# Patient Record
Sex: Male | Born: 1940 | Race: White | Hispanic: No | Marital: Married | Smoking: Current every day smoker
Health system: Southern US, Community
[De-identification: ages and names within clinical notes are randomized; demographics above are authoritative.]

## PROBLEM LIST (undated history)

## (undated) DIAGNOSIS — R238 Other skin changes: Secondary | ICD-10-CM

## (undated) DIAGNOSIS — I10 Essential (primary) hypertension: Secondary | ICD-10-CM

## (undated) DIAGNOSIS — R233 Spontaneous ecchymoses: Secondary | ICD-10-CM

## (undated) DIAGNOSIS — J189 Pneumonia, unspecified organism: Secondary | ICD-10-CM

## (undated) DIAGNOSIS — M1990 Unspecified osteoarthritis, unspecified site: Secondary | ICD-10-CM

## (undated) HISTORY — PX: TONSILLECTOMY: SUR1361

## (undated) HISTORY — PX: KNEE ARTHROSCOPY: SUR90

## (undated) HISTORY — PX: COLONOSCOPY: SHX174

---

## 2001-10-16 ENCOUNTER — Emergency Department (HOSPITAL_COMMUNITY): Admission: EM | Admit: 2001-10-16 | Discharge: 2001-10-16 | Payer: Self-pay | Admitting: *Deleted

## 2003-11-17 ENCOUNTER — Emergency Department (HOSPITAL_COMMUNITY): Admission: EM | Admit: 2003-11-17 | Discharge: 2003-11-18 | Payer: Self-pay | Admitting: Emergency Medicine

## 2012-12-30 ENCOUNTER — Other Ambulatory Visit: Payer: Self-pay | Admitting: Family Medicine

## 2012-12-30 DIAGNOSIS — Z87891 Personal history of nicotine dependence: Secondary | ICD-10-CM

## 2013-01-05 ENCOUNTER — Ambulatory Visit
Admission: RE | Admit: 2013-01-05 | Discharge: 2013-01-05 | Disposition: A | Discharge status: Home / Self Care | Payer: Medicare Other | Source: Ambulatory Visit | Attending: Family Medicine | Admitting: Family Medicine

## 2013-01-05 DIAGNOSIS — Z87891 Personal history of nicotine dependence: Secondary | ICD-10-CM

## 2014-08-07 IMAGING — US US AORTA SCREENING (MEDICARE)
1 series · 14 of 19 positions shown · non-contrast
Comparison: None.

CLINICAL DATA: Screening for abdominal aortic aneurysm.  Smoker.

EXAM:
ABDOMINAL AORTA SCREENING ULTRASOUND
TECHNIQUE: Ultrasound examination of the abdominal aorta was performed as a
screening evaluation for abdominal aortic aneurysm.

[Series 1: us aorta screening (medicare) · 0.21mm/px · 14 of 19 slices shown]
[im 1/19]
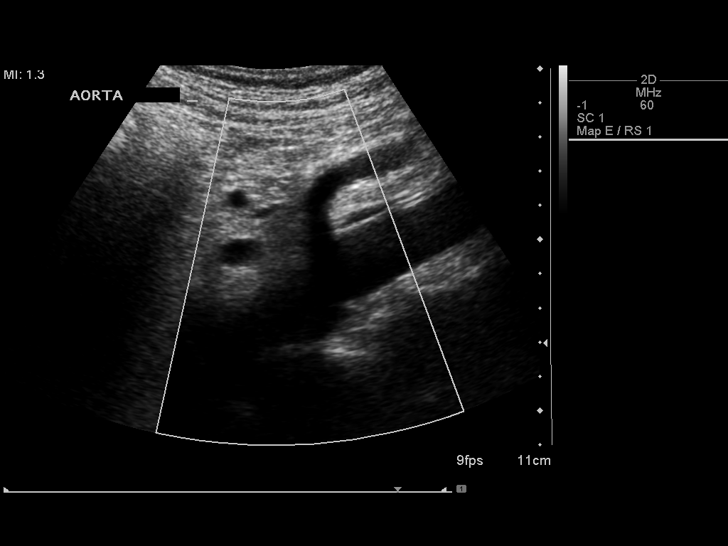
[im 3/19]
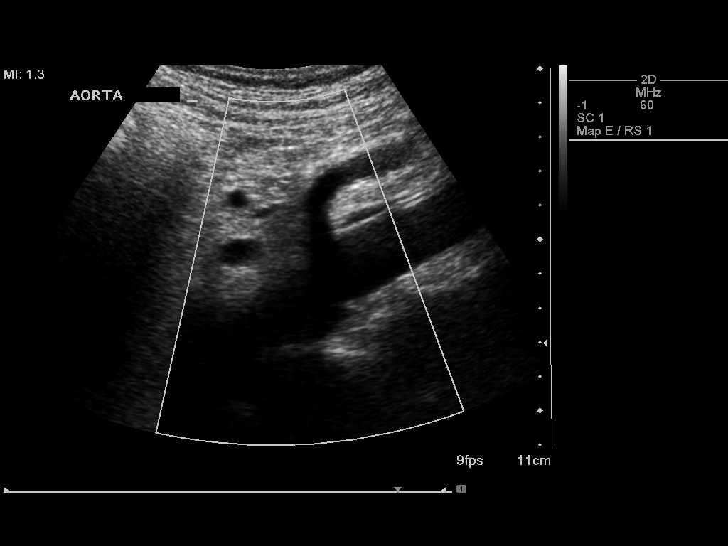
[im 4/19]
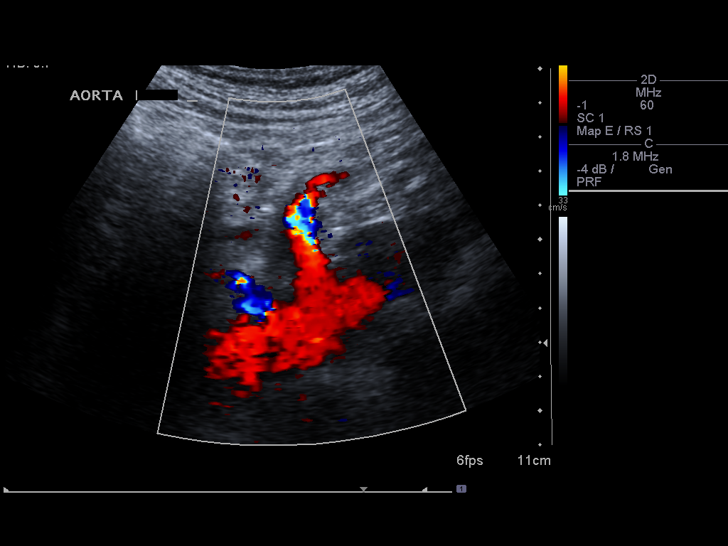
[im 5/19]
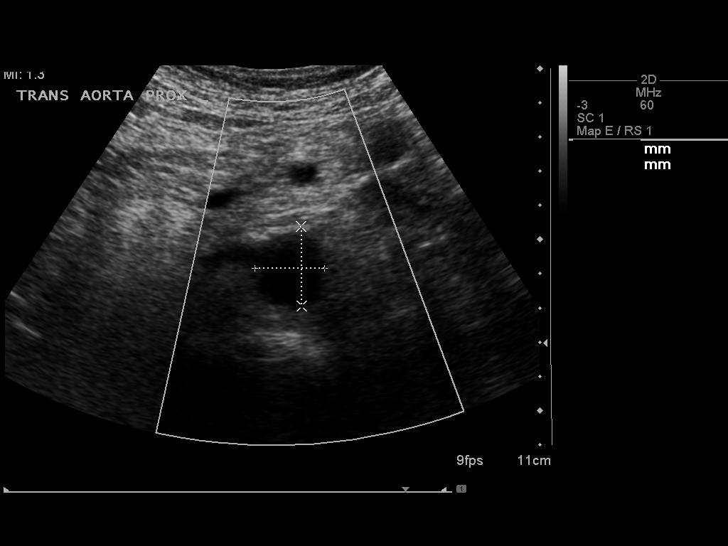
[im 7/19]
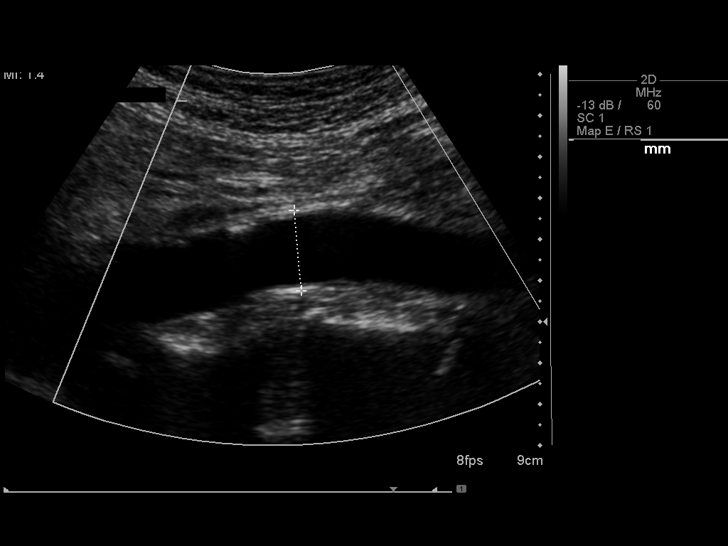
[im 8/19]
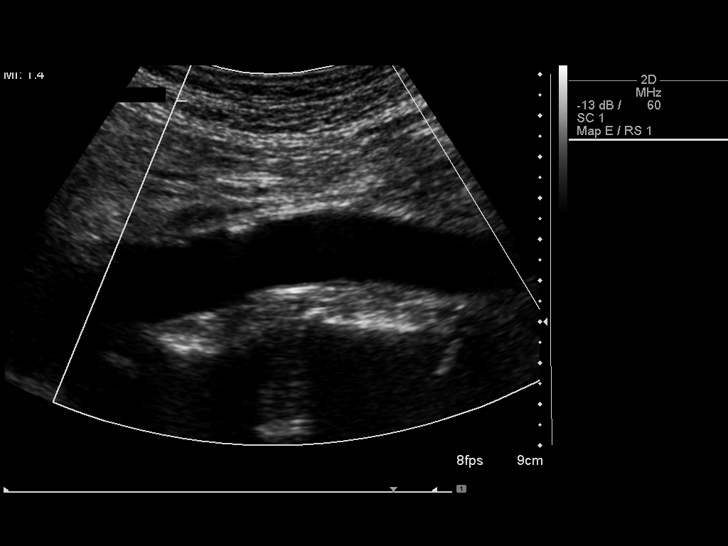
[im 9/19]
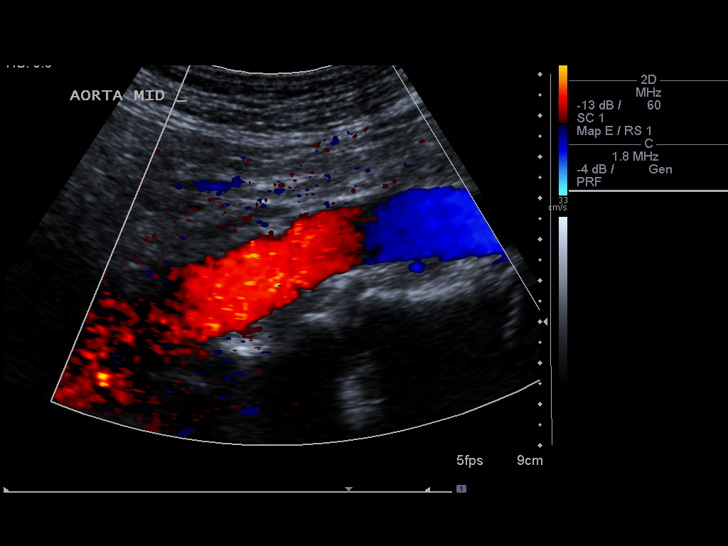
[im 11/19]
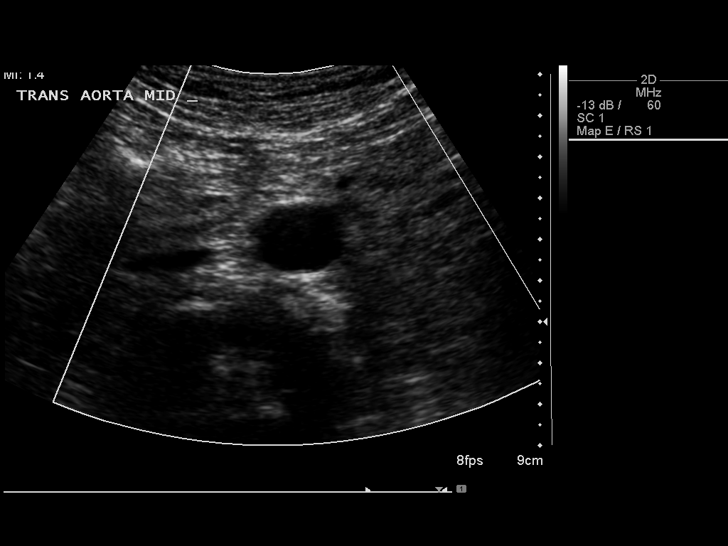
[im 12/19]
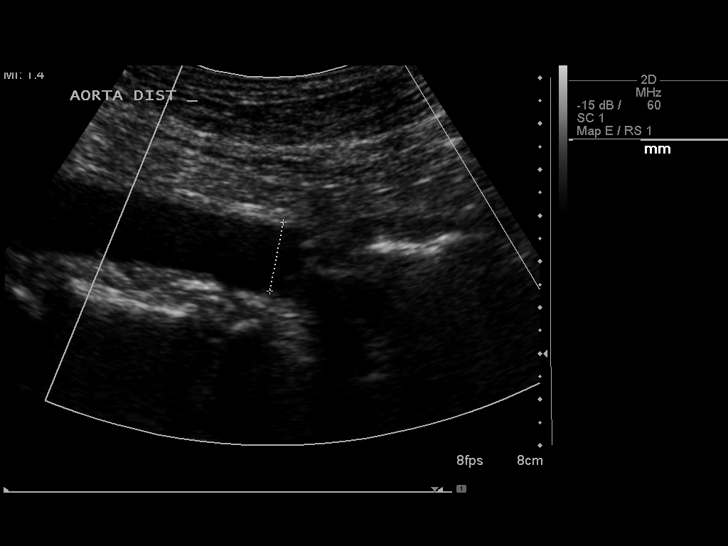
[im 13/19]
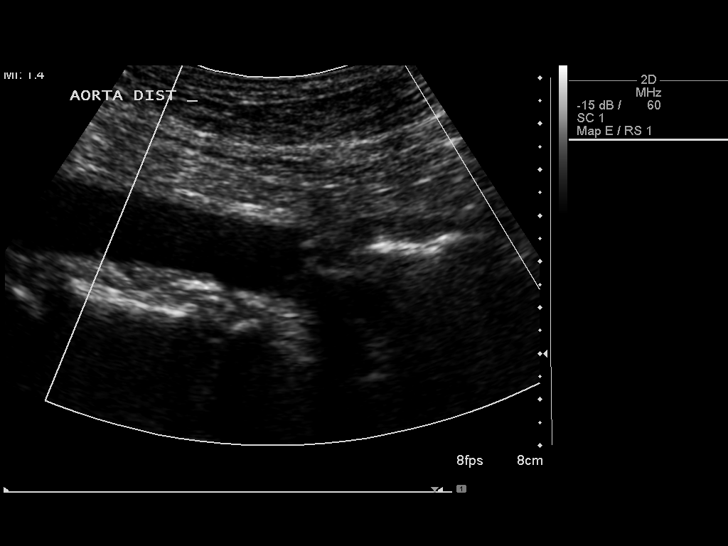
[im 15/19]
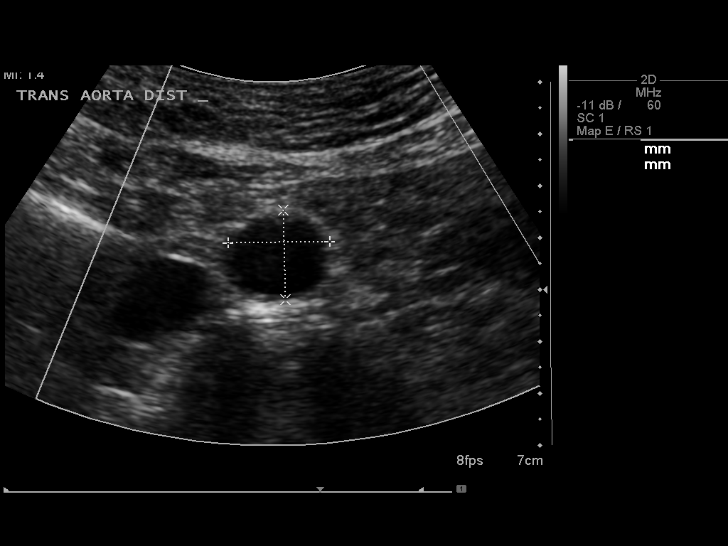
[im 16/19]
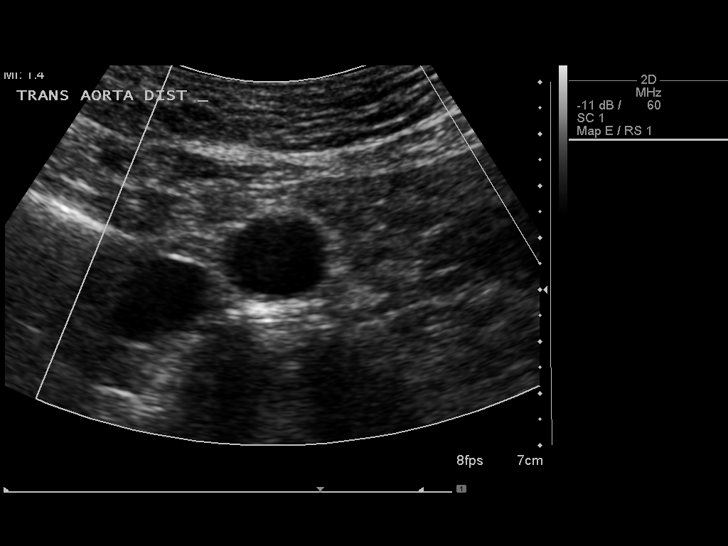
[im 17/19]
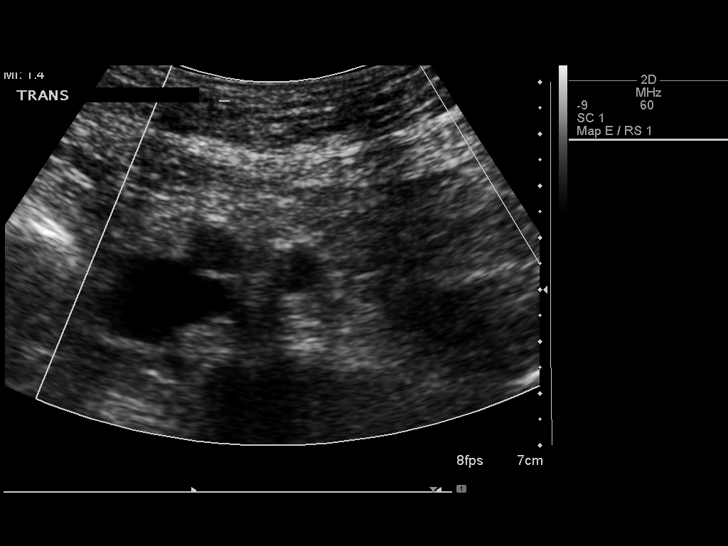
[im 19/19]
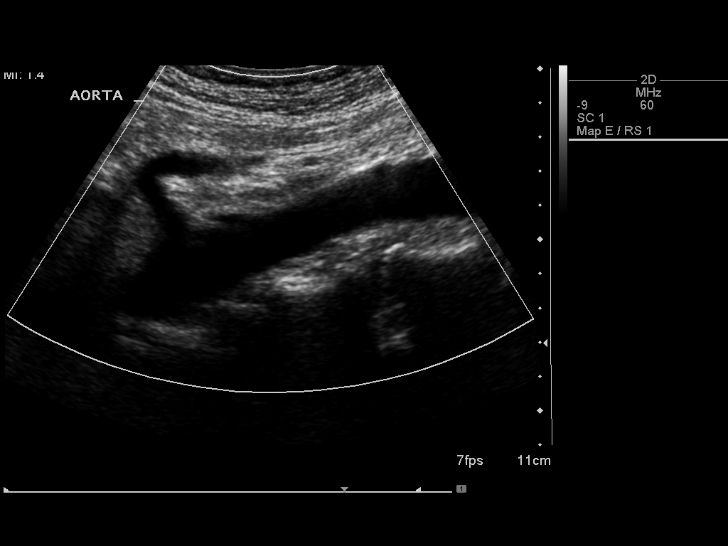

[14 of 19 positions shown; findings below may reference images not displayed]

FINDINGS: Abdominal Aorta: No aneurysm identified.

Maximum AP diameter:  2.1 . Cm

Maximum TRV diameter:  2.0 cm
IMPRESSION: Normal exam.

## 2015-03-13 ENCOUNTER — Other Ambulatory Visit: Payer: Self-pay | Admitting: Orthopedic Surgery

## 2015-03-29 NOTE — Pre-Procedure Instructions (Signed)
Alexander Ryan  03/29/2015     Your procedure is scheduled on : Monday April 15, 2015 at 7:30 AM.  Report to [facility] Admitting at 5:30 AM.  Call this number if you have problems the morning of surgery: [phone]    Remember:  Do not eat food or drink liquids after midnight.  Take these medicines the morning of surgery with A SIP OF WATER : NONE   Stop taking any vitamins, herbal medications/supplements, NSAIDs, Ibuprofen, Advil, Motrin, Aleve, etc on Monday February 13th   Do not wear jewelry.  Do not wear lotions, powders, or cologne.   Men may shave face and neck.  Do not bring valuables to the hospital.  [facility] is not responsible for any belongings or valuables.  Contacts, dentures or bridgework may not be worn into surgery.  Leave your suitcase in the car.  After surgery it may be brought to your room.  For patients admitted to the hospital, discharge time will be determined by your treatment team.  Patients discharged the day of surgery will not be allowed to drive home.   Name and phone number of your driver:    Special instructions:  Shower using CHG soap the night before and the morning of your surgery  Please read over the following fact sheets that you were given. Pain Booklet, Coughing and Deep Breathing, Total Joint Packet, MRSA Information and Surgical Site Infection Prevention

## 2015-04-01 ENCOUNTER — Encounter (HOSPITAL_COMMUNITY)
Admission: RE | Admit: 2015-04-01 | Discharge: 2015-04-01 | Disposition: A | Discharge status: Home / Self Care | Payer: Medicare Other | Source: Ambulatory Visit | Attending: Orthopedic Surgery | Admitting: Orthopedic Surgery

## 2015-04-01 ENCOUNTER — Encounter (HOSPITAL_COMMUNITY): Payer: Self-pay

## 2015-04-01 DIAGNOSIS — M1712 Unilateral primary osteoarthritis, left knee: Secondary | ICD-10-CM | POA: Diagnosis not present

## 2015-04-01 DIAGNOSIS — Z01812 Encounter for preprocedural laboratory examination: Secondary | ICD-10-CM | POA: Insufficient documentation

## 2015-04-01 DIAGNOSIS — Z01818 Encounter for other preprocedural examination: Secondary | ICD-10-CM | POA: Diagnosis present

## 2015-04-01 HISTORY — DX: Pneumonia, unspecified organism: J18.9

## 2015-04-01 HISTORY — DX: Essential (primary) hypertension: I10

## 2015-04-01 HISTORY — DX: Unspecified osteoarthritis, unspecified site: M19.90

## 2015-04-01 HISTORY — DX: Spontaneous ecchymoses: R23.3

## 2015-04-01 HISTORY — DX: Other skin changes: R23.8

## 2015-04-01 LAB — URINALYSIS, ROUTINE W REFLEX MICROSCOPIC
BILIRUBIN URINE: NEGATIVE
Glucose, UA: NEGATIVE mg/dL
KETONES UR: NEGATIVE mg/dL
LEUKOCYTES UA: NEGATIVE
NITRITE: NEGATIVE
Protein, ur: NEGATIVE mg/dL
SPECIFIC GRAVITY, URINE: 1.009 (ref 1.005–1.030)
pH: 6.5 (ref 5.0–8.0)

## 2015-04-01 LAB — URINE MICROSCOPIC-ADD ON
Bacteria, UA: NONE SEEN
WBC, UA: NONE SEEN WBC/hpf (ref 0–5)

## 2015-04-01 LAB — COMPREHENSIVE METABOLIC PANEL
ALBUMIN: 4.2 g/dL (ref 3.5–5.0)
ALT: 14 U/L — ABNORMAL LOW (ref 17–63)
ANION GAP: 12 (ref 5–15)
AST: 18 U/L (ref 15–41)
Alkaline Phosphatase: 92 U/L (ref 38–126)
BILIRUBIN TOTAL: 0.9 mg/dL (ref 0.3–1.2)
BUN: 17 mg/dL (ref 6–20)
CHLORIDE: 97 mmol/L — AB (ref 101–111)
CO2: 24 mmol/L (ref 22–32)
Calcium: 9.8 mg/dL (ref 8.9–10.3)
Creatinine, Ser: 1.04 mg/dL (ref 0.61–1.24)
GFR calc Af Amer: >60 mL/min (ref >60)
GFR calc non Af Amer: >60 mL/min (ref >60)
GLUCOSE: 112 mg/dL — AB (ref 65–99)
POTASSIUM: 4 mmol/L (ref 3.5–5.1)
SODIUM: 133 mmol/L — AB (ref 135–145)
TOTAL PROTEIN: 6.8 g/dL (ref 6.5–8.1)

## 2015-04-01 LAB — SURGICAL PCR SCREEN
MRSA, PCR: NEGATIVE
STAPHYLOCOCCUS AUREUS: NEGATIVE

## 2015-04-01 LAB — CBC WITH DIFFERENTIAL/PLATELET
BASOS ABS: 0 10*3/uL (ref 0.0–0.1)
BASOS PCT: 1 %
EOS ABS: 0.2 10*3/uL (ref 0.0–0.7)
EOS PCT: 2 %
HEMATOCRIT: 44.4 % (ref 39.0–52.0)
Hemoglobin: 15.1 g/dL (ref 13.0–17.0)
Lymphocytes Relative: 21 %
Lymphs Abs: 1.7 10*3/uL (ref 0.7–4.0)
MCH: 28.8 pg (ref 26.0–34.0)
MCHC: 34 g/dL (ref 30.0–36.0)
MCV: 84.6 fL (ref 78.0–100.0)
MONO ABS: 0.6 10*3/uL (ref 0.1–1.0)
MONOS PCT: 8 %
Neutro Abs: 5.4 10*3/uL (ref 1.7–7.7)
Neutrophils Relative %: 68 %
PLATELETS: 157 10*3/uL (ref 150–400)
RBC: 5.25 MIL/uL (ref 4.22–5.81)
RDW: 12.6 % (ref 11.5–15.5)
WBC: 7.8 10*3/uL (ref 4.0–10.5)

## 2015-04-01 LAB — PROTIME-INR
INR: 0.99 (ref 0.00–1.49)
PROTHROMBIN TIME: 13.3 s (ref 11.6–15.2)

## 2015-04-01 LAB — APTT: APTT: 40 s — AB (ref 24–37)

## 2015-04-01 NOTE — Progress Notes (Signed)
PCP is Tally Joe  Patient denied having any acute cardiac or pulmonary issues

## 2015-04-02 LAB — URINE CULTURE: Culture: 2000

## 2015-04-12 MED ORDER — SODIUM CHLORIDE 0.9 % IV SOLN: INTRAVENOUS | Status: DC

## 2015-04-12 MED ORDER — BUPIVACAINE LIPOSOME 1.3 % IJ SUSP
20.0000 mL | Freq: Once | INTRAMUSCULAR | Status: AC
  Administered 2015-04-15: 20 mL
  Filled 2015-04-12: qty 20

## 2015-04-12 MED ORDER — CHLORHEXIDINE GLUCONATE 4 % EX LIQD: 60.0000 mL | Freq: Once | CUTANEOUS | Status: DC

## 2015-04-12 MED ORDER — TRANEXAMIC ACID 1000 MG/10ML IV SOLN
1000.0000 mg | INTRAVENOUS | Status: AC
  Administered 2015-04-15: 1000 mg via INTRAVENOUS
  Filled 2015-04-12: qty 10

## 2015-04-12 MED ORDER — CEFAZOLIN SODIUM-DEXTROSE 2-3 GM-% IV SOLR
2.0000 g | INTRAVENOUS | Status: AC
  Administered 2015-04-15: 2 g via INTRAVENOUS
  Filled 2015-04-12: qty 50

## 2015-04-14 NOTE — Anesthesia Preprocedure Evaluation (Addendum)
Anesthesia Evaluation  Patient identified by MRN, date of birth, ID band Patient awake    Reviewed: Allergy & Precautions, H&P , NPO status , Patient's Chart, lab work & pertinent test results  Airway Mallampati: III  TM Distance: >3 FB Neck ROM: Full    Dental no notable dental hx. (+) Teeth Intact, Dental Advisory Given   Pulmonary Current Smoker,    Pulmonary exam normal breath sounds clear to auscultation       Cardiovascular hypertension, Pt. on medications  Rhythm:Regular Rate:Normal     Neuro/Psych negative neurological ROS  negative psych ROS   GI/Hepatic negative GI ROS, Neg liver ROS,   Endo/Other  negative endocrine ROS  Renal/GU negative Renal ROS  negative genitourinary   Musculoskeletal  (+) Arthritis , Osteoarthritis,    Abdominal   Peds  Hematology negative hematology ROS (+)   Anesthesia Other Findings   Reproductive/Obstetrics negative OB ROS                            Anesthesia Physical Anesthesia Plan  ASA: II  Anesthesia Plan: MAC and Spinal   Post-op Pain Management:    Induction: Intravenous  Airway Management Planned: Simple Face Mask  Additional Equipment:   Intra-op Plan:   Post-operative Plan:   Informed Consent: I have reviewed the patients History and Physical, chart, labs and discussed the procedure including the risks, benefits and alternatives for the proposed anesthesia with the patient or authorized representative who has indicated his/her understanding and acceptance.   Dental advisory given  Plan Discussed with: CRNA  Anesthesia Plan Comments:         Anesthesia Quick Evaluation

## 2015-04-15 ENCOUNTER — Encounter (HOSPITAL_COMMUNITY)
Admission: RE | Disposition: A | Discharge status: Home Health | Payer: Self-pay | Source: Ambulatory Visit | Attending: Orthopedic Surgery

## 2015-04-15 ENCOUNTER — Inpatient Hospital Stay (HOSPITAL_COMMUNITY): Payer: Medicare Other | Admitting: Anesthesiology

## 2015-04-15 ENCOUNTER — Encounter (HOSPITAL_COMMUNITY): Payer: Self-pay | Admitting: Surgery

## 2015-04-15 ENCOUNTER — Inpatient Hospital Stay (HOSPITAL_COMMUNITY)
Admission: RE | Admit: 2015-04-15 | Discharge: 2015-04-16 | DRG: 470 | Disposition: A | Discharge status: Home Health | Payer: Medicare Other | Source: Ambulatory Visit | Attending: Orthopedic Surgery | Admitting: Orthopedic Surgery

## 2015-04-15 DIAGNOSIS — Z96659 Presence of unspecified artificial knee joint: Secondary | ICD-10-CM

## 2015-04-15 DIAGNOSIS — M1712 Unilateral primary osteoarthritis, left knee: Principal | ICD-10-CM | POA: Diagnosis present

## 2015-04-15 DIAGNOSIS — F17290 Nicotine dependence, other tobacco product, uncomplicated: Secondary | ICD-10-CM | POA: Diagnosis present

## 2015-04-15 DIAGNOSIS — M25562 Pain in left knee: Secondary | ICD-10-CM | POA: Diagnosis present

## 2015-04-15 DIAGNOSIS — I10 Essential (primary) hypertension: Secondary | ICD-10-CM | POA: Diagnosis present

## 2015-04-15 DIAGNOSIS — D62 Acute posthemorrhagic anemia: Secondary | ICD-10-CM | POA: Diagnosis not present

## 2015-04-15 HISTORY — PX: TOTAL KNEE ARTHROPLASTY: SHX125

## 2015-04-15 LAB — CREATININE, SERUM
Creatinine, Ser: 0.81 mg/dL (ref 0.61–1.24)
GFR calc non Af Amer: >60 mL/min (ref >60)

## 2015-04-15 LAB — CBC
HEMATOCRIT: 37 % — AB (ref 39.0–52.0)
Hemoglobin: 12.7 g/dL — ABNORMAL LOW (ref 13.0–17.0)
MCH: 29.3 pg (ref 26.0–34.0)
MCHC: 34.3 g/dL (ref 30.0–36.0)
MCV: 85.3 fL (ref 78.0–100.0)
Platelets: 157 10*3/uL (ref 150–400)
RBC: 4.34 MIL/uL (ref 4.22–5.81)
RDW: 12.4 % (ref 11.5–15.5)
WBC: 10.4 10*3/uL (ref 4.0–10.5)

## 2015-04-15 SURGERY — ARTHROPLASTY, KNEE, TOTAL
Anesthesia: Monitor Anesthesia Care | Site: Knee | Laterality: Left

## 2015-04-15 MED ORDER — ONDANSETRON HCL 4 MG/2ML IJ SOLN
4.0000 mg | Freq: Four times a day (QID) | INTRAMUSCULAR | Status: DC | PRN
  Administered 2015-04-15: 4 mg via INTRAVENOUS
  Filled 2015-04-15: qty 2

## 2015-04-15 MED ORDER — ONDANSETRON HCL 4 MG/2ML IJ SOLN
INTRAMUSCULAR | Status: AC
  Filled 2015-04-15: qty 2

## 2015-04-15 MED ORDER — EPHEDRINE SULFATE 50 MG/ML IJ SOLN
INTRAMUSCULAR | Status: AC
  Filled 2015-04-15: qty 1

## 2015-04-15 MED ORDER — ONDANSETRON HCL 4 MG PO TABS: 4.0000 mg | ORAL_TABLET | Freq: Four times a day (QID) | ORAL | Status: DC | PRN

## 2015-04-15 MED ORDER — LIDOCAINE HCL (CARDIAC) 20 MG/ML IV SOLN
INTRAVENOUS | Status: AC
  Filled 2015-04-15: qty 5

## 2015-04-15 MED ORDER — CELECOXIB 200 MG PO CAPS
200.0000 mg | ORAL_CAPSULE | Freq: Two times a day (BID) | ORAL | Status: DC
  Administered 2015-04-15 – 2015-04-16 (×2): 200 mg via ORAL
  Filled 2015-04-15 (×2): qty 1

## 2015-04-15 MED ORDER — HYDROMORPHONE HCL 1 MG/ML IJ SOLN
INTRAMUSCULAR | Status: AC
  Filled 2015-04-15: qty 1

## 2015-04-15 MED ORDER — TRANEXAMIC ACID 1000 MG/10ML IV SOLN: 1000.0000 mg | Freq: Once | INTRAVENOUS | Status: DC

## 2015-04-15 MED ORDER — ACETAMINOPHEN 325 MG PO TABS: 650.0000 mg | ORAL_TABLET | Freq: Four times a day (QID) | ORAL | Status: DC | PRN

## 2015-04-15 MED ORDER — BUPIVACAINE-EPINEPHRINE (PF) 0.25% -1:200000 IJ SOLN
INTRAMUSCULAR | Status: DC | PRN
  Administered 2015-04-15: 30 mL via PERINEURAL

## 2015-04-15 MED ORDER — TRANEXAMIC ACID 1000 MG/10ML IV SOLN
1000.0000 mg | Freq: Once | INTRAVENOUS | Status: AC
  Administered 2015-04-15: 1000 mg via INTRAVENOUS
  Filled 2015-04-15: qty 10

## 2015-04-15 MED ORDER — DOCUSATE SODIUM 100 MG PO CAPS
100.0000 mg | ORAL_CAPSULE | Freq: Two times a day (BID) | ORAL | Status: DC
  Administered 2015-04-15 – 2015-04-16 (×3): 100 mg via ORAL
  Filled 2015-04-15 (×3): qty 1

## 2015-04-15 MED ORDER — OXYCODONE HCL ER 10 MG PO T12A
10.0000 mg | EXTENDED_RELEASE_TABLET | Freq: Two times a day (BID) | ORAL | Status: DC
  Administered 2015-04-15 – 2015-04-16 (×2): 10 mg via ORAL
  Filled 2015-04-15 (×2): qty 1

## 2015-04-15 MED ORDER — BUPIVACAINE IN DEXTROSE 0.75-8.25 % IT SOLN
INTRATHECAL | Status: DC | PRN
  Administered 2015-04-15: 13.5 mg via INTRATHECAL

## 2015-04-15 MED ORDER — ROCURONIUM BROMIDE 50 MG/5ML IV SOLN
INTRAVENOUS | Status: AC
  Filled 2015-04-15: qty 1

## 2015-04-15 MED ORDER — SENNOSIDES-DOCUSATE SODIUM 8.6-50 MG PO TABS: 1.0000 | ORAL_TABLET | Freq: Every evening | ORAL | Status: DC | PRN

## 2015-04-15 MED ORDER — DIPHENHYDRAMINE HCL 12.5 MG/5ML PO ELIX: 12.5000 mg | ORAL_SOLUTION | ORAL | Status: DC | PRN

## 2015-04-15 MED ORDER — HYDROCHLOROTHIAZIDE 25 MG PO TABS
25.0000 mg | ORAL_TABLET | Freq: Every day | ORAL | Status: DC
  Administered 2015-04-16: 25 mg via ORAL
  Filled 2015-04-15: qty 1

## 2015-04-15 MED ORDER — PHENOL 1.4 % MT LIQD: 1.0000 | OROMUCOSAL | Status: DC | PRN

## 2015-04-15 MED ORDER — SODIUM CHLORIDE 0.9 % IJ SOLN
INTRAMUSCULAR | Status: DC | PRN
  Administered 2015-04-15: 20 mL via INTRAVENOUS

## 2015-04-15 MED ORDER — METOCLOPRAMIDE HCL 5 MG/ML IJ SOLN: 5.0000 mg | Freq: Three times a day (TID) | INTRAMUSCULAR | Status: DC | PRN

## 2015-04-15 MED ORDER — SODIUM CHLORIDE 0.9 % IJ SOLN
INTRAMUSCULAR | Status: AC
  Filled 2015-04-15: qty 10

## 2015-04-15 MED ORDER — ACETAMINOPHEN 650 MG RE SUPP: 650.0000 mg | Freq: Four times a day (QID) | RECTAL | Status: DC | PRN

## 2015-04-15 MED ORDER — SODIUM CHLORIDE 0.9 % IV SOLN
INTRAVENOUS | Status: DC
  Administered 2015-04-15: 15:00:00 via INTRAVENOUS

## 2015-04-15 MED ORDER — FENTANYL CITRATE (PF) 100 MCG/2ML IJ SOLN: INTRAMUSCULAR | Status: DC | PRN

## 2015-04-15 MED ORDER — BISACODYL 5 MG PO TBEC: 5.0000 mg | DELAYED_RELEASE_TABLET | Freq: Every day | ORAL | Status: DC | PRN

## 2015-04-15 MED ORDER — HYDROMORPHONE HCL 1 MG/ML IJ SOLN
0.2500 mg | INTRAMUSCULAR | Status: DC | PRN
  Administered 2015-04-15 (×2): 0.5 mg via INTRAVENOUS
  Administered 2015-04-15 (×2): 0.25 mg via INTRAVENOUS

## 2015-04-15 MED ORDER — LACTATED RINGERS IV SOLN
INTRAVENOUS | Status: DC | PRN
  Administered 2015-04-15 (×2): via INTRAVENOUS

## 2015-04-15 MED ORDER — MIDAZOLAM HCL 5 MG/5ML IJ SOLN
INTRAMUSCULAR | Status: DC | PRN
  Administered 2015-04-15 (×2): 1 mg via INTRAVENOUS

## 2015-04-15 MED ORDER — OXYCODONE HCL 5 MG PO TABS
5.0000 mg | ORAL_TABLET | ORAL | Status: DC | PRN
  Administered 2015-04-15: 5 mg via ORAL
  Administered 2015-04-15 – 2015-04-16 (×3): 10 mg via ORAL
  Filled 2015-04-15 (×3): qty 2

## 2015-04-15 MED ORDER — SUCCINYLCHOLINE CHLORIDE 20 MG/ML IJ SOLN
INTRAMUSCULAR | Status: AC
  Filled 2015-04-15: qty 1

## 2015-04-15 MED ORDER — PHENYLEPHRINE HCL 10 MG/ML IJ SOLN
INTRAMUSCULAR | Status: DC | PRN
  Administered 2015-04-15 (×2): 80 ug via INTRAVENOUS
  Administered 2015-04-15: 40 ug via INTRAVENOUS

## 2015-04-15 MED ORDER — BUPIVACAINE HCL (PF) 0.5 % IJ SOLN
INTRAMUSCULAR | Status: AC
  Filled 2015-04-15: qty 30

## 2015-04-15 MED ORDER — SODIUM CHLORIDE 0.9 % IR SOLN
Status: DC | PRN
  Administered 2015-04-15: 1000 mL

## 2015-04-15 MED ORDER — ZOLPIDEM TARTRATE 5 MG PO TABS: 5.0000 mg | ORAL_TABLET | Freq: Every evening | ORAL | Status: DC | PRN

## 2015-04-15 MED ORDER — METHOCARBAMOL 500 MG PO TABS
500.0000 mg | ORAL_TABLET | Freq: Four times a day (QID) | ORAL | Status: DC | PRN
  Administered 2015-04-15 – 2015-04-16 (×2): 500 mg via ORAL
  Filled 2015-04-15 (×2): qty 1

## 2015-04-15 MED ORDER — CEFAZOLIN SODIUM 1-5 GM-% IV SOLN
1.0000 g | Freq: Four times a day (QID) | INTRAVENOUS | Status: AC
  Administered 2015-04-15 (×2): 1 g via INTRAVENOUS
  Filled 2015-04-15 (×2): qty 50

## 2015-04-15 MED ORDER — HYDROMORPHONE HCL 1 MG/ML IJ SOLN
1.0000 mg | INTRAMUSCULAR | Status: DC | PRN
  Administered 2015-04-15: 1 mg via INTRAVENOUS
  Filled 2015-04-15: qty 1

## 2015-04-15 MED ORDER — OXYCODONE HCL 5 MG PO TABS
ORAL_TABLET | ORAL | Status: AC
  Filled 2015-04-15: qty 1

## 2015-04-15 MED ORDER — METOCLOPRAMIDE HCL 5 MG PO TABS: 5.0000 mg | ORAL_TABLET | Freq: Three times a day (TID) | ORAL | Status: DC | PRN

## 2015-04-15 MED ORDER — PROPOFOL 10 MG/ML IV BOLUS
INTRAVENOUS | Status: AC
  Filled 2015-04-15: qty 20

## 2015-04-15 MED ORDER — AMLODIPINE BESYLATE 10 MG PO TABS
10.0000 mg | ORAL_TABLET | Freq: Every day | ORAL | Status: DC
  Administered 2015-04-16: 10 mg via ORAL
  Filled 2015-04-15: qty 1

## 2015-04-15 MED ORDER — ALBUMIN HUMAN 5 % IV SOLN
12.5000 g | Freq: Once | INTRAVENOUS | Status: AC
  Administered 2015-04-15: 12.5 g via INTRAVENOUS

## 2015-04-15 MED ORDER — DEXTROSE 5 % IV SOLN: 500.0000 mg | Freq: Four times a day (QID) | INTRAVENOUS | Status: DC | PRN

## 2015-04-15 MED ORDER — PROPOFOL 500 MG/50ML IV EMUL
INTRAVENOUS | Status: DC | PRN
  Administered 2015-04-15: 100 ug/kg/min via INTRAVENOUS

## 2015-04-15 MED ORDER — PHENYLEPHRINE 40 MCG/ML (10ML) SYRINGE FOR IV PUSH (FOR BLOOD PRESSURE SUPPORT)
PREFILLED_SYRINGE | INTRAVENOUS | Status: AC
  Filled 2015-04-15: qty 10

## 2015-04-15 MED ORDER — FLEET ENEMA 7-19 GM/118ML RE ENEM: 1.0000 | ENEMA | Freq: Once | RECTAL | Status: DC | PRN

## 2015-04-15 MED ORDER — BUPIVACAINE HCL (PF) 0.25 % IJ SOLN
INTRAMUSCULAR | Status: AC
  Filled 2015-04-15: qty 30

## 2015-04-15 MED ORDER — MENTHOL 3 MG MT LOZG: 1.0000 | LOZENGE | OROMUCOSAL | Status: DC | PRN

## 2015-04-15 MED ORDER — ALBUMIN HUMAN 5 % IV SOLN
INTRAVENOUS | Status: AC
  Filled 2015-04-15: qty 250

## 2015-04-15 MED ORDER — ALUM & MAG HYDROXIDE-SIMETH 200-200-20 MG/5ML PO SUSP
30.0000 mL | ORAL | Status: DC | PRN
  Administered 2015-04-16: 30 mL via ORAL
  Filled 2015-04-15: qty 30

## 2015-04-15 MED ORDER — FENTANYL CITRATE (PF) 250 MCG/5ML IJ SOLN
INTRAMUSCULAR | Status: AC
Start: 2015-04-15 — End: 2015-04-15
  Filled 2015-04-15: qty 5

## 2015-04-15 MED ORDER — LOSARTAN POTASSIUM-HCTZ 100-25 MG PO TABS: 1.0000 | ORAL_TABLET | Freq: Every day | ORAL | Status: DC

## 2015-04-15 MED ORDER — LOSARTAN POTASSIUM 50 MG PO TABS
100.0000 mg | ORAL_TABLET | Freq: Every day | ORAL | Status: DC
  Administered 2015-04-16: 100 mg via ORAL
  Filled 2015-04-15: qty 2

## 2015-04-15 MED ORDER — ENOXAPARIN SODIUM 30 MG/0.3ML ~~LOC~~ SOLN
30.0000 mg | Freq: Two times a day (BID) | SUBCUTANEOUS | Status: DC
  Administered 2015-04-16: 30 mg via SUBCUTANEOUS
  Filled 2015-04-15: qty 0.3

## 2015-04-15 MED ORDER — MIDAZOLAM HCL 2 MG/2ML IJ SOLN
INTRAMUSCULAR | Status: AC
  Filled 2015-04-15: qty 2

## 2015-04-15 SURGICAL SUPPLY — 64 items
BANDAGE ESMARK 6X9 LF (GAUZE/BANDAGES/DRESSINGS) ×1 IMPLANT
BLADE SAGITTAL 13X1.27X60 (BLADE) ×2 IMPLANT
BLADE SAGITTAL 13X1.27X60MM (BLADE) ×1
BLADE SAW SGTL 83.5X18.5 (BLADE) ×3 IMPLANT
BLADE SURG 10 STRL SS (BLADE) ×3 IMPLANT
BNDG ESMARK 6X9 LF (GAUZE/BANDAGES/DRESSINGS) ×3
BOWL SMART MIX CTS (DISPOSABLE) ×3 IMPLANT
CAPT KNEE TOTAL 3 ×3 IMPLANT
CEMENT BONE SIMPLEX SPEEDSET (Cement) ×9 IMPLANT
COVER SURGICAL LIGHT HANDLE (MISCELLANEOUS) ×3 IMPLANT
CUFF TOURNIQUET SINGLE 34IN LL (TOURNIQUET CUFF) ×3 IMPLANT
DRAPE EXTREMITY T 121X128X90 (DRAPE) ×3 IMPLANT
DRAPE INCISE IOBAN 66X45 STRL (DRAPES) ×6 IMPLANT
DRAPE PROXIMA HALF (DRAPES) IMPLANT
DRAPE U-SHAPE 47X51 STRL (DRAPES) ×3 IMPLANT
DRSG ADAPTIC 3X8 NADH LF (GAUZE/BANDAGES/DRESSINGS) ×3 IMPLANT
DRSG PAD ABDOMINAL 8X10 ST (GAUZE/BANDAGES/DRESSINGS) ×3 IMPLANT
DURAPREP 26ML APPLICATOR (WOUND CARE) ×6 IMPLANT
ELECT REM PT RETURN 9FT ADLT (ELECTROSURGICAL) ×3
ELECTRODE REM PT RTRN 9FT ADLT (ELECTROSURGICAL) ×1 IMPLANT
GAUZE SPONGE 4X4 12PLY STRL (GAUZE/BANDAGES/DRESSINGS) ×3 IMPLANT
GAUZE VASELINE 3X9 (GAUZE/BANDAGES/DRESSINGS) ×3 IMPLANT
GLOVE BIOGEL M 7.0 STRL (GLOVE) IMPLANT
GLOVE BIOGEL PI IND STRL 7.0 (GLOVE) ×2 IMPLANT
GLOVE BIOGEL PI IND STRL 7.5 (GLOVE) IMPLANT
GLOVE BIOGEL PI IND STRL 8.5 (GLOVE) ×5 IMPLANT
GLOVE BIOGEL PI INDICATOR 7.0 (GLOVE) ×4
GLOVE BIOGEL PI INDICATOR 7.5 (GLOVE)
GLOVE BIOGEL PI INDICATOR 8.5 (GLOVE) ×10
GLOVE ECLIPSE 6.5 STRL STRAW (GLOVE) ×6 IMPLANT
GLOVE ORTHOPEDIC STR SZ6.5 (GLOVE) ×3 IMPLANT
GLOVE SURG ORTHO 8.0 STRL STRW (GLOVE) ×18 IMPLANT
GOWN STRL REUS W/ TWL LRG LVL3 (GOWN DISPOSABLE) ×1 IMPLANT
GOWN STRL REUS W/ TWL XL LVL3 (GOWN DISPOSABLE) ×2 IMPLANT
GOWN STRL REUS W/TWL 2XL LVL3 (GOWN DISPOSABLE) ×3 IMPLANT
GOWN STRL REUS W/TWL LRG LVL3 (GOWN DISPOSABLE) ×2
GOWN STRL REUS W/TWL XL LVL3 (GOWN DISPOSABLE) ×4
HANDPIECE INTERPULSE COAX TIP (DISPOSABLE) ×2
HOOD PEEL AWAY FACE SHEILD DIS (HOOD) ×9 IMPLANT
KIT BASIN OR (CUSTOM PROCEDURE TRAY) ×3 IMPLANT
KIT ROOM TURNOVER OR (KITS) ×3 IMPLANT
KNEE CAPITATED TOTAL 3 ×1 IMPLANT
MANIFOLD NEPTUNE II (INSTRUMENTS) ×3 IMPLANT
NEEDLE 22X1 1/2 (OR ONLY) (NEEDLE) ×6 IMPLANT
NS IRRIG 1000ML POUR BTL (IV SOLUTION) ×3 IMPLANT
PACK TOTAL JOINT (CUSTOM PROCEDURE TRAY) ×3 IMPLANT
PACK UNIVERSAL I (CUSTOM PROCEDURE TRAY) ×3 IMPLANT
PAD ARMBOARD 7.5X6 YLW CONV (MISCELLANEOUS) ×6 IMPLANT
PADDING CAST COTTON 6X4 STRL (CAST SUPPLIES) ×3 IMPLANT
SET HNDPC FAN SPRY TIP SCT (DISPOSABLE) ×1 IMPLANT
SPONGE GAUZE 4X4 12PLY STER LF (GAUZE/BANDAGES/DRESSINGS) ×3 IMPLANT
STAPLER VISISTAT 35W (STAPLE) ×3 IMPLANT
SUCTION FRAZIER HANDLE 10FR (MISCELLANEOUS) ×2
SUCTION TUBE FRAZIER 10FR DISP (MISCELLANEOUS) ×1 IMPLANT
SUT BONE WAX W31G (SUTURE) ×3 IMPLANT
SUT VIC AB 0 CTB1 27 (SUTURE) ×6 IMPLANT
SUT VIC AB 1 CT1 27 (SUTURE) ×4
SUT VIC AB 1 CT1 27XBRD ANBCTR (SUTURE) ×2 IMPLANT
SUT VIC AB 2-0 CT1 27 (SUTURE) ×4
SUT VIC AB 2-0 CT1 TAPERPNT 27 (SUTURE) ×2 IMPLANT
SYR 20CC LL (SYRINGE) ×6 IMPLANT
TOWEL OR 17X24 6PK STRL BLUE (TOWEL DISPOSABLE) ×3 IMPLANT
TOWEL OR 17X26 10 PK STRL BLUE (TOWEL DISPOSABLE) ×3 IMPLANT
WATER STERILE IRR 1000ML POUR (IV SOLUTION) ×6 IMPLANT

## 2015-04-15 NOTE — Op Note (Signed)
PATIENT ID:      Alexander Ryan  MRN:     [id number] DOB/AGE:    1940/03/01 / 75 y.o.                                             _____________________________           [name] ASSISTANT NOTE        I assisted Surgeon:  Georgena Spurling, MD   ON THE PROCEDURE:  Procedure(s): LEFT TOTAL KNEE ARTHROPLASTY on 04/15/2015    I provided my assistance on this case for assistance with exposure, retraction, bleeding    control, instrumentation, and closure         Electronicallly signed by SURGERY ASSISTING PHYSICIAN ASSISTANT Guy Sandifer 04/15/2015  9:48 AM

## 2015-04-15 NOTE — Anesthesia Procedure Notes (Signed)
Spinal Patient location during procedure: OR Start time: 04/15/2015 7:35 AM End time: 04/15/2015 7:38 AM Staffing Anesthesiologist: Gaynelle Adu Performed by: anesthesiologist  Preanesthetic Checklist Completed: patient identified, surgical consent, pre-op evaluation, timeout performed, IV checked, risks and benefits discussed and monitors and equipment checked Spinal Block Patient position: sitting Prep: DuraPrep Patient monitoring: cardiac monitor, continuous pulse ox and blood pressure Approach: midline Location: L3-4 Injection technique: single-shot Needle Needle type: Pencan  Needle gauge: 24 G Needle length: 9 cm Assessment Sensory level: T8 Additional Notes Functioning IV was confirmed and monitors were applied. Sterile prep and drape, including hand hygiene and sterile gloves were used. The patient was positioned and the spine was prepped. The skin was anesthetized with lidocaine.  Free flow of clear CSF was obtained prior to injecting local anesthetic into the CSF.  The spinal needle aspirated freely following injection.  The needle was carefully withdrawn.  The patient tolerated the procedure well.

## 2015-04-15 NOTE — Anesthesia Postprocedure Evaluation (Signed)
Anesthesia Post Note  Patient: Alexander Ryan  Procedure(s) Performed: Procedure(s) (LRB): LEFT TOTAL KNEE ARTHROPLASTY (Left)  Patient location during evaluation: PACU Anesthesia Type: Spinal and MAC Level of consciousness: awake and alert Pain management: pain level controlled Vital Signs Assessment: post-procedure vital signs reviewed and stable Respiratory status: spontaneous breathing and respiratory function stable Cardiovascular status: blood pressure returned to baseline and stable Postop Assessment: spinal receding Anesthetic complications: no    Last Vitals:  Filed Vitals:   04/15/15 1030 04/15/15 1045  BP: 113/75 112/73  Pulse: 63 64  Temp:    Resp: 12 12    Last Pain:  Filed Vitals:   04/15/15 1103  PainSc: 6                  Cylinda Santoli,W. EDMOND

## 2015-04-15 NOTE — Transfer of Care (Signed)
Immediate Anesthesia Transfer of Care Note  Patient: Alexander Ryan  Procedure(s) Performed: Procedure(s): LEFT TOTAL KNEE ARTHROPLASTY (Left)  Patient Location: PACU  Anesthesia Type:MAC and Spinal  Level of Consciousness: awake, alert , oriented and patient cooperative  Airway & Oxygen Therapy: Patient Spontanous Breathing and Patient connected to nasal cannula oxygen  Post-op Assessment: Report given to RN and Post -op Vital signs reviewed and stable  Post vital signs: Reviewed and stable  Last Vitals:  Filed Vitals:   04/15/15 0554 04/15/15 0950  BP: 152/80   Pulse: 76 67  Temp: 36.5 C 36.6 C  Resp: 20 11    Complications: No apparent anesthesia complications

## 2015-04-15 NOTE — Evaluation (Signed)
Physical Therapy Evaluation Patient Details Name: Alexander Ryan MRN: [id number] DOB: Oct 21, 1940 Today's Date: 04/15/2015   History of Present Illness  75 y.o. male admitted to [facility] on 04/15/15 for elective L TKA.  Pt with significant PMHx of HTN, and R knee arthroscopy.    Clinical Impression  Pt is POD #0 and is moving well with min assist overall.  He was limited this evening by feelings of nausea and lightheadedness.  Better once feet up and settled in the chair.  Pt will likely progress well enough to d/c home with family's supervision.   PT to follow acutely for deficits listed below.       Follow Up Recommendations Home health PT;Supervision for mobility/OOB    Equipment Recommendations  None recommended by PT    Recommendations for Other Services   NA    Precautions / Restrictions Precautions Precautions: Knee Precaution Booklet Issued: Yes (comment) Precaution Comments: exercise handout given Restrictions Weight Bearing Restrictions: Yes LLE Weight Bearing: Weight bearing as tolerated      Mobility  Bed Mobility Overal bed mobility: Needs Assistance Bed Mobility: Supine to Sit     Supine to sit: Min assist;HOB elevated     General bed mobility comments: Min assist to support left leg to help progress it to EOB. Pt using bed rail for leverage at trunk.   Transfers Overall transfer level: Needs assistance Equipment used: Rolling walker (2 wheeled) Transfers: Sit to/from Stand Sit to Stand: Min assist         General transfer comment: Min assist to support trunk to get to standing.   Ambulation/Gait Ambulation/Gait assistance: Min assist Ambulation Distance (Feet): 5 Feet Assistive device: Rolling walker (2 wheeled) Gait Pattern/deviations: Step-to pattern;Antalgic     General Gait Details: Pt with moderately antalgic gait pattern.  Assist needed at trunk for balance.  Verbal cues for safe RW use.  Pt's gait distance limited by ligheadedness and nausea.  BP  taken after sitting and 107/55         Balance Overall balance assessment: Needs assistance Sitting-balance support: Feet supported;Bilateral upper extremity supported Sitting balance-Leahy Scale: Good     Standing balance support: Bilateral upper extremity supported Standing balance-Leahy Scale: Poor                               Pertinent Vitals/Pain Pain Assessment: 0-10 Pain Score: 7  Pain Location: left knee Pain Descriptors / Indicators: Aching;Burning Pain Intervention(s): Limited activity within patient's tolerance;Monitored during session;Repositioned    Home Living Family/patient expects to be discharged to:: Private residence Living Arrangements: Spouse/significant other Available Help at Discharge: Family;Available 24 hours/day (wife has health issues, other family nearby) Type of Home: House Home Access: Stairs to enter Entrance Stairs-Rails: Right Entrance Stairs-Number of Steps: 4 Home Layout: One level Home Equipment: Walker - 2 wheels;Shower seat;Grab bars - tub/shower      Prior Function Level of Independence: Independent         Comments: retired, 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 [employer]   Dominant Hand: Right    Extremity/Trunk Assessment   Upper Extremity Assessment: Overall WFL for tasks assessed           Lower Extremity Assessment: LLE deficits/detail   LLE Deficits / Details: left leg with normal post op pain and weakness, ankle 3/5, knee 2/5, hip 2+/5  Cervical / Trunk Assessment: Normal  Communication   Communication:  No difficulties  Cognition Arousal/Alertness: Awake/alert Behavior During Therapy: WFL for tasks assessed/performed Overall Cognitive Status: Within Functional Limits for tasks assessed                         Exercises Total Joint Exercises Ankle Circles/Pumps: AROM;Both;20 reps      Assessment/Plan    PT Assessment Patient needs continued PT services  PT  Diagnosis Difficulty walking;Abnormality of gait;Acute pain;Generalized weakness   PT Problem List Decreased strength;Decreased range of motion;Decreased activity tolerance;Decreased balance;Decreased mobility;Decreased knowledge of use of DME;Pain;Decreased knowledge of precautions  PT Treatment Interventions DME instruction;Stair training;Gait training;Functional mobility training;Therapeutic exercise;Therapeutic activities;Balance training;Neuromuscular re-education;Patient/family education;Manual techniques;Modalities   PT Goals (Current goals can be found in the Care Plan section) Acute Rehab PT Goals Patient Stated Goal: to get back to golf and fishing PT Goal Formulation: With patient Time For Goal Achievement: 04/22/15 Potential to Achieve Goals: Good    Frequency 7X/week           End of Session   Activity Tolerance: Patient limited by pain;Other (comment) (limited by lightheadedness) Patient left: in chair;with call bell/phone within reach Nurse Communication: Mobility status         Time: 1027-2536 PT Time Calculation (min) (ACUTE ONLY): 27 min   Charges:   PT Evaluation $PT Eval Low Complexity: 1 Procedure PT Treatments $Therapeutic Activity: 8-22 mins        Joscelynn Brutus B. Kennetta Pavlovic, PT, DPT [phone]   04/15/2015, 4:43 PM

## 2015-04-15 NOTE — Progress Notes (Signed)
Utilization review completed.  

## 2015-04-15 NOTE — H&P (Signed)
  Orion Mole MRN:  [id number] DOB/SEX:  [birth year]/male  CHIEF COMPLAINT:  Painful left Knee  HISTORY: Patient is a 75 y.o. male presented with a history of pain in the left knee. Onset of symptoms was gradual starting several years ago with gradually worsening course since that time. Prior procedures on the knee include none. Patient has been treated conservatively with over-the-counter NSAIDs and activity modification. Patient currently rates pain in the knee at 9 out of 10 with activity. There is pain at night.  PAST MEDICAL HISTORY: There are no active problems to display for this patient.  Past Medical History  Diagnosis Date  . Hypertension   . Pneumonia     hx of  . Arthritis   . Bruises easily    Past Surgical History  Procedure Laterality Date  . Knee arthroscopy Right   . Tonsillectomy      age 60  . Colonoscopy       MEDICATIONS:   Prescriptions prior to admission  Medication Sig Dispense Refill Last Dose  . amLODipine (NORVASC) 10 MG tablet Take 1 tablet by mouth daily.   04/14/2015 at Unknown time  . losartan-hydrochlorothiazide (HYZAAR) 100-25 MG tablet Take 1 tablet by mouth daily.   04/14/2015 at Unknown time    ALLERGIES:  No Known Allergies  REVIEW OF SYSTEMS:  Pertinent items are noted in HPI.   FAMILY HISTORY:  History reviewed. No pertinent family history.  SOCIAL HISTORY:   Social History  Substance Use Topics  . Smoking status: Current Every Day Smoker    Types: Cigars  . Smokeless tobacco: Not on file  . Alcohol Use: No     EXAMINATION:  Vital signs in last 24 hours: Temp:  [97.7 F (36.5 C)] 97.7 F (36.5 C) (02/20 0554) Pulse Rate:  [76] 76 (02/20 0554) Resp:  [20] 20 (02/20 0554) BP: (152)/(80) 152/80 mmHg (02/20 0554) SpO2:  [98 %] 98 % (02/20 0554)  General appearance: alert and cooperative Lungs: clear to auscultation bilaterally Heart: regular rate and rhythm, S1, S2 normal, no murmur, click, rub or gallop Abdomen: soft,  non-tender; bowel sounds normal; no masses,  no organomegaly Extremities: extremities normal, atraumatic, no cyanosis or edema Pulses: 2+ and symmetric Skin: Skin color, texture, turgor normal. No rashes or lesions Neurologic: Grossly normal  Musculoskeletal:  ROM 0-105, Ligaments intact,  Imaging Review Plain radiographs demonstrate severe degenerative joint disease of the left knee. The overall alignment is significant varus. The bone quality appears to be good for age and reported activity level.  Assessment/Plan: Primary osteoarthritis, left knee   The patient history, physical examination and imaging studies are consistent with advanced degenerative joint disease of the left knee. The patient has failed conservative treatment.  The clearance notes were reviewed.  After discussion with the patient it was felt that Total Knee Replacement was indicated. The procedure,  risks, and benefits of total knee arthroplasty were presented and reviewed. The risks including but not limited to aseptic loosening, infection, blood clots, vascular injury, stiffness, patella tracking problems complications among others were discussed. The patient acknowledged the explanation, agreed to proceed with the plan.  Guy Sandifer 04/15/2015, 7:03 AM

## 2015-04-16 ENCOUNTER — Encounter (HOSPITAL_COMMUNITY): Payer: Self-pay | Admitting: Orthopedic Surgery

## 2015-04-16 LAB — BASIC METABOLIC PANEL
Anion gap: 9 (ref 5–15)
BUN: 17 mg/dL (ref 6–20)
CALCIUM: 8.9 mg/dL (ref 8.9–10.3)
CHLORIDE: 95 mmol/L — AB (ref 101–111)
CO2: 27 mmol/L (ref 22–32)
CREATININE: 0.9 mg/dL (ref 0.61–1.24)
Glucose, Bld: 125 mg/dL — ABNORMAL HIGH (ref 65–99)
Potassium: 4.4 mmol/L (ref 3.5–5.1)
SODIUM: 131 mmol/L — AB (ref 135–145)

## 2015-04-16 LAB — CBC
HCT: 33.3 % — ABNORMAL LOW (ref 39.0–52.0)
HEMOGLOBIN: 11.1 g/dL — AB (ref 13.0–17.0)
MCH: 28.2 pg (ref 26.0–34.0)
MCHC: 33.3 g/dL (ref 30.0–36.0)
MCV: 84.5 fL (ref 78.0–100.0)
PLATELETS: 167 10*3/uL (ref 150–400)
RBC: 3.94 MIL/uL — ABNORMAL LOW (ref 4.22–5.81)
RDW: 12.4 % (ref 11.5–15.5)
WBC: 8 10*3/uL (ref 4.0–10.5)

## 2015-04-16 MED ORDER — ONDANSETRON HCL 4 MG PO TABS: 4.0000 mg | ORAL_TABLET | Freq: Four times a day (QID) | ORAL | Status: AC | PRN

## 2015-04-16 MED ORDER — CELECOXIB 200 MG PO CAPS: 200.0000 mg | ORAL_CAPSULE | Freq: Two times a day (BID) | ORAL | Status: AC

## 2015-04-16 MED ORDER — METHOCARBAMOL 500 MG PO TABS: 500.0000 mg | ORAL_TABLET | Freq: Four times a day (QID) | ORAL | Status: AC | PRN

## 2015-04-16 MED ORDER — ENOXAPARIN SODIUM 40 MG/0.4ML ~~LOC~~ SOLN: 40.0000 mg | SUBCUTANEOUS | Status: AC

## 2015-04-16 MED ORDER — OXYCODONE HCL ER 10 MG PO T12A: 10.0000 mg | EXTENDED_RELEASE_TABLET | Freq: Two times a day (BID) | ORAL | Status: AC

## 2015-04-16 MED ORDER — OXYCODONE HCL 5 MG PO TABS: 5.0000 mg | ORAL_TABLET | ORAL | Status: AC | PRN

## 2015-04-16 NOTE — Progress Notes (Signed)
Discharged home with home health; family present at the bedside at the time of d/c.

## 2015-04-16 NOTE — Progress Notes (Signed)
Physical Therapy Treatment Patient Details Name: Alexander Ryan MRN: [id number] DOB: 09-09-1940 Today's Date: 04/16/2015    History of Present Illness 75 y.o. male admitted to [facility] on 04/15/15 for elective L TKA.  Pt with significant PMHx of HTN, and R knee arthroscopy.      PT Comments    Pt is POD #1 second session and is progressing nicely.  He was able to demonstrate less assistance overall with gait and transfers and was able to demonstrate the ability to simulate home entry on the stairs this session.  He plans to d/c home later this afternoon and PT will follow acutely until d/c confirmed.    Follow Up Recommendations  Home health PT;Supervision for mobility/OOB     Equipment Recommendations  None recommended by PT    Recommendations for Other Services   NA     Precautions / Restrictions Precautions Precautions: Knee Precaution Booklet Issued: Yes (comment) Precaution Comments: knee handout given and knee precaution reviewed Restrictions LLE Weight Bearing: Weight bearing as tolerated    Mobility  Bed Mobility Overal bed mobility: Needs Assistance Bed Mobility: Supine to Sit     Supine to sit: Min assist     General bed mobility comments: Min assist to help progres left leg over EOB  Transfers Overall transfer level: Needs assistance Equipment used: Rolling walker (2 wheeled) Transfers: Sit to/from Stand Sit to Stand: Min guard         General transfer comment: verbal cues for safe hand placement, min guard assist for safety/balance from low recliner chair.  Verbal cues for safe transitions and left knee positioning during transitions.   Ambulation/Gait Ambulation/Gait assistance: Supervision Ambulation Distance (Feet): 200 Feet Assistive device: Rolling walker (2 wheeled) Gait Pattern/deviations: Step-through pattern;Antalgic Gait velocity: decreased Gait velocity interpretation: Below normal speed for age/gender General Gait Details: Mildly antalgic  gait pattern, working on step through and good heel to toe contact.  Verbal cues for upright posture, RW adjusted down to fit height/arm length better.    Stairs Stairs: Yes Stairs assistance: Min assist Stair Management: One rail Right;Step to pattern;Forwards (turned a bit to the right so that both hands can reach rail) Number of Stairs: 3 General stair comments: Pt needed min assist as he was weight bearing      Balance Overall balance assessment: Needs assistance Sitting-balance support: Feet supported;No upper extremity supported Sitting balance-Leahy Scale: Good     Standing balance support: No upper extremity supported;Single extremity supported;Bilateral upper extremity supported Standing balance-Leahy Scale: Fair                      Cognition Arousal/Alertness: Awake/alert Behavior During Therapy: WFL for tasks assessed/performed Overall Cognitive Status: Within Functional Limits for tasks assessed                      Exercises Total Joint Exercises Ankle Circles/Pumps: AROM;Both;20 reps Quad Sets: AROM;Left;10 reps Towel Squeeze: AROM;Both;10 reps Short Arc Quad: AROM;Left;10 reps Heel Slides: AAROM;Left;10 reps Hip ABduction/ADduction: AROM;Left;10 reps Straight Leg Raises: AROM;AAROM;Left;10 reps Long Arc Quad: AROM;Left;10 reps Knee Flexion: AROM;AAROM;Left;10 reps Goniometric ROM: 15-45        Pertinent Vitals/Pain Pain Assessment: 0-10 Pain Score: 7  Pain Location: left thigh/knee Pain Descriptors / Indicators: Sore Pain Intervention(s): Limited activity within patient's tolerance;Monitored during session;Repositioned           PT Goals (current goals can now be found in the care plan section) Acute Rehab PT Goals Patient  Stated Goal: to get back to golf and fishing Progress towards PT goals: Progressing toward goals    Frequency  7X/week    PT Plan Current plan remains appropriate       End of Session Equipment  Utilized During Treatment: Gait belt Activity Tolerance: Patient limited by pain Patient left: in chair;with call bell/phone within reach     Time: 1335-1358 PT Time Calculation (min) (ACUTE ONLY): 23 min  Charges:  $Gait Training: 8-22 mins $Therapeutic Exercise: 8-22 mins $Therapeutic Activity: 8-22 mins                      Choua Chalker B. Maico Mulvehill, PT, DPT [phone]   04/16/2015, 2:56 PM

## 2015-04-16 NOTE — Care Management Note (Signed)
Case Management Note  Patient Details  Name: Alexander Ryan MRN: [id number] Date of Birth: 08/11/1940  Subjective/Objective:      S/p left total knee arthroplasty              Action/Plan: Set up with [facility] for HHPT by MD office. Spoke with patient, no change in discharge plan. Patient stated that his wife, son and daughter would be able to assist him after discharge. [employer] has delivered CPM, rolling walker and 3N1 to patient's home.   Expected Discharge Date:                  Expected Discharge Plan:  Home w Home Health Services  In-House Referral:  NA  Discharge planning Services  CM Consult  Post Acute Care Choice:  Durable Medical Equipment, Home Health Choice offered to:  Patient  DME Arranged:  3-N-1, CPM, Walker rolling DME Agency:  [employer]  HH Arranged:  PT HH Agency:  [facility]  Status of Service:  Completed, signed off  Medicare Important Message Given:    Date Medicare IM Given:    Medicare IM give by:    Date Additional Medicare IM Given:    Additional Medicare Important Message give by:     If discussed at Long Length of Stay Meetings, dates discussed:    Additional Comments:  Monica Becton, RN 04/16/2015, 10:35 AM

## 2015-04-16 NOTE — Progress Notes (Signed)
Occupational Therapy Evaluation Patient Details Name: Unknown Schleyer MRN: [id number] DOB: May 09, 1940 Today's Date: 04/16/2015    History of Present Illness 75 y.o. male admitted to [facility] on 04/15/15 for elective L TKA.  Pt with significant PMHx of HTN, and R knee arthroscopy.     Clinical Impression   PTA, pt was independent with ADLs and mobility. Pt currently requires min assist for LB ADLs and supervision for OOB mobility. Educated pt on knee precautions, compensatory strategies for ADLs, and use of ice/elevation for pain/edema management. Also educated pt to gradually increase activity level, use energy conservation, and fall prevention strategies. Pt is adequate for discharge from occupational therapy standpoint. No OT follow up recommended and DME has been delivered to pt's home.    Follow Up Recommendations  No OT follow up;Supervision/Assistance - 24 hour    Equipment Recommendations  3 in 1 bedside comode (already delivered to pt's home)    Recommendations for Other Services       Precautions / Restrictions Precautions Precautions: Knee Precaution Booklet Issued: No Precaution Comments: Reviewed not placing pillow or other object underneath knee Restrictions Weight Bearing Restrictions: Yes LLE Weight Bearing: Weight bearing as tolerated      Mobility Bed Mobility Overal bed mobility: Needs Assistance Bed Mobility: Supine to Sit;Sit to Supine     Supine to sit: Min assist Sit to supine: Min guard   General bed mobility comments: Min assist with therapist under L heel to progress it to EOB. HOB flat, use of bedrails.  Transfers Overall transfer level: Needs assistance Equipment used: Rolling walker (2 wheeled) Transfers: Sit to/from Stand Sit to Stand: Supervision         General transfer comment: Supervision for safety. Cues for safe hand placement. No LOB or reports of dizzness upon standing.    Balance Overall balance assessment: Needs  assistance Sitting-balance support: No upper extremity supported;Feet supported Sitting balance-Leahy Scale: Good     Standing balance support: No upper extremity supported;During functional activity Standing balance-Leahy Scale: Poor Standing balance comment: Reliant on RW for UE support during static standing tasks                            ADL Overall ADL's : Needs assistance/impaired     Grooming: Wash/dry hands;Supervision/safety;Standing   Upper Body Bathing: Supervision/ safety;Sitting   Lower Body Bathing: Minimal assistance;Sitting/lateral leans   Upper Body Dressing : Supervision/safety;Sitting   Lower Body Dressing: Minimal assistance;Cueing for compensatory techniques;Sit to/from stand Lower Body Dressing Details (indicate cue type and reason): Unable to reach bilateral feet, reports wife can assist with this. Educated pt to dress LLE first and undress it last Toilet Transfer: Supervision/safety;Cueing for safety;Ambulation;BSC;RW Toilet Transfer Details (indicate cue type and reason): BSC over toilet, cues for safe hand placement Toileting- Clothing Manipulation and Hygiene: Supervision/safety;Sit to/from stand   Tub/ Shower Transfer: Tub transfer;Min guard;Ambulation;3 in 1;Cueing for sequencing;Rolling walker Tub/Shower Transfer Details (indicate cue type and reason): Simulated tub transfer with 3in1 as shower seat in room. Cues for RW placement and safe hand placement Functional mobility during ADLs: Supervision/safety;Rolling walker General ADL Comments: Reviewed knee precautions, compensatory strategies for transfers and ADLs, and using ice/elevation for pain/edeme management. Educated pt to gradually increase activity level, energy conservation and fall prevention strategies. Pt's SaO2 on RA remained above 92% at rest and during activity      Vision Vision Assessment?: No apparent visual deficits   Perception     Praxis  Pertinent  Vitals/Pain Pain Assessment: 0-10 Pain Score: 3  Pain Location: L thigh Pain Descriptors / Indicators: Aching Pain Intervention(s): Limited activity within patient's tolerance;Monitored during session;Repositioned     Hand Dominance Right   Extremity/Trunk Assessment Upper Extremity Assessment Upper Extremity Assessment: Overall WFL for tasks assessed   Lower Extremity Assessment Lower Extremity Assessment: LLE deficits/detail LLE Deficits / Details: decreased ROM and strength as expected post op   Cervical / Trunk Assessment Cervical / Trunk Assessment: Normal   Communication Communication Communication: No difficulties   Cognition Arousal/Alertness: Awake/alert Behavior During Therapy: WFL for tasks assessed/performed Overall Cognitive Status: Within Functional Limits for tasks assessed                     General Comments       Exercises       Shoulder Instructions      Home Living Family/patient expects to be discharged to:: Private residence Living Arrangements: Spouse/significant other Available Help at Discharge: Family;Available 24 hours/day;Neighbor (wife has health issues, other family and neighbors nearby) Type of Home: House Home Access: Stairs to enter [employer] of Steps: 4 Entrance Stairs-Rails: Right Home Layout: One level     Bathroom Shower/Tub: Tub/shower unit;Curtain Shower/tub characteristics: Engineer, building services: Standard Bathroom Accessibility: Yes How Accessible: Accessible via walker Home Equipment: Walker - 2 wheels;Shower seat;Grab bars - tub/shower;Bedside commode (BSC delivered to house already)          Prior Functioning/Environment Level of Independence: Independent        Comments: retired, has a part time Airline pilot route- [employer]    OT Diagnosis: Acute pain   OT Problem List: Decreased strength;Decreased range of motion;Decreased activity tolerance;Impaired balance (sitting and/or  standing);Decreased coordination;Decreased safety awareness;Decreased knowledge of use of DME or AE;Decreased knowledge of precautions;Pain   OT Treatment/Interventions:      OT Goals(Current goals can be found in the care plan section) Acute Rehab OT Goals Patient Stated Goal: to get back to golf and fishing OT Goal Formulation: With patient Time For Goal Achievement: 04/30/15 Potential to Achieve Goals: Good  OT Frequency:     Barriers to D/C:            Co-evaluation              End of Session CPM Left Knee CPM Left Knee: Off Left Knee Flexion (Degrees): 60 Left Knee Extension (Degrees): 0 Additional Comments: 0 bone foam applied  Activity Tolerance:   Patient left:     Time: 9604-5409 OT Time Calculation (min): 29 min Charges:  OT General Charges $OT Visit: 1 Procedure OT Evaluation $OT Eval Moderate Complexity: 1 Procedure OT Treatments $Self Care/Home Management : 8-22 mins G-Codes:    Nils Pyle, OTR/L Pager: [phone] 04/16/2015, 10:16 AM

## 2015-04-16 NOTE — Progress Notes (Signed)
SPORTS MEDICINE AND JOINT REPLACEMENT  Georgena Spurling, MD   Altamese Cabal, PA-C [address] [place], [place], [place]  [address]                             [phone]   PROGRESS NOTE  Subjective:  negative for Chest Pain  negative for Shortness of Breath  negative for Nausea/Vomiting   negative for Calf Pain  negative for Bowel Movement   Tolerating Diet: yes         Patient reports pain as 6 on 0-10 scale.    Objective: Vital signs in last 24 hours:   Patient Vitals for the past 24 hrs:  BP Temp Temp src Pulse Resp SpO2  04/16/15 0458 129/70 mmHg 98.2 F (36.8 C) - 73 18 97 %  04/15/15 2107 121/66 mmHg 98 F (36.7 C) Oral 72 17 100 %  04/15/15 1419 100/70 mmHg 98.2 F (36.8 C) Oral 69 16 100 %  04/15/15 1330 117/77 mmHg 98.1 F (36.7 C) - 67 12 100 %  04/15/15 1300 110/79 mmHg - - 72 15 99 %  04/15/15 1230 106/70 mmHg - - 66 10 98 %  04/15/15 1200 122/75 mmHg - - 67 13 98 %  04/15/15 1130 (!) 115/97 mmHg 97.5 F (36.4 C) - 69 (!) 21 100 %  04/15/15 1115 112/76 mmHg - - (!) 58 12 97 %  04/15/15 1100 120/72 mmHg - - 60 12 99 %  04/15/15 1045 112/73 mmHg - - 64 12 96 %  04/15/15 1030 113/75 mmHg - - 63 12 100 %  04/15/15 1015 (!) 106/59 mmHg - - (!) 58 11 100 %  04/15/15 1000 (!) 82/60 mmHg - - 65 16 99 %  04/15/15 0950 (!) 83/55 mmHg 97.8 F (36.6 C) - 67 11 98 %    {1959:LAST@   Intake/Output from previous day:   02/20 0701 - 02/21 0700 In: 1890 [P.O.:240; I.V.:1600] Out: 850 [Urine:700]   Intake/Output this shift:   02/20 1901 - 02/21 0700 In: -  Out: 200 [Urine:200]   Intake/Output      02/20 0701 - 02/21 0700   P.O. 240   I.V. 1600   IV Piggyback 50   Total Intake 1890   Urine 700   Blood 150   Total Output 850   Net +1040          LABORATORY DATA:  Recent Labs  04/15/15 1440  WBC 10.4  HGB 12.7*  HCT 37.0*  PLT 157    Recent Labs  04/15/15 1440  CREATININE 0.81   Lab Results  Component Value Date   INR 0.99 04/01/2015     Examination:  General appearance: alert, cooperative and no distress Extremities: extremities normal, atraumatic, no cyanosis or edema  Wound Exam: clean, dry, intact   Drainage:  Scant/small amount Serous exudate  Motor Exam: Quadriceps and Hamstrings Intact  Sensory Exam: Superficial Peroneal and Tibial normal   Assessment:    1 Day Post-Op  Procedure(s) (LRB): LEFT TOTAL KNEE ARTHROPLASTY (Left)  ADDITIONAL DIAGNOSIS:  Active Problems:   S/P total knee arthroplasty  Acute Blood Loss Anemia   Plan: Physical Therapy as ordered Weight Bearing as Tolerated (WBAT)  DVT Prophylaxis:  Lovenox  DISCHARGE PLAN: Home  DISCHARGE NEEDS: HHPT         Guy Sandifer 04/16/2015, 6:51 AM

## 2015-04-16 NOTE — Discharge Instructions (Signed)

## 2015-04-16 NOTE — Progress Notes (Signed)
Physical Therapy Treatment Patient Details Name: Alexander Ryan MRN: [id number] DOB: [birth year] Today's Date: 04/16/2015    History of Present Illness 75 y.o. male admitted to [facility] on 04/15/15 for elective L TKA.  Pt with significant PMHx of HTN, and R knee arthroscopy.      PT Comments    Pt is progressing well with his mobility and was able to walk into the hallway with the RW min guard progressing up to supervision level.  He will need to review stairs simulating home entry early afternoon to prepare him for potential planned d/c today.  PT will continue to follow.   Follow Up Recommendations  Home health PT;Supervision for mobility/OOB     Equipment Recommendations  None recommended by PT    Recommendations for Other Services   NA     Precautions / Restrictions Precautions Precautions: Knee Precaution Booklet Issued: Yes (comment) Precaution Comments: knee handout given and knee precaution reviewed Restrictions Weight Bearing Restrictions: Yes LLE Weight Bearing: Weight bearing as tolerated    Mobility  Bed Mobility Overal bed mobility: Needs Assistance Bed Mobility: Supine to Sit     Supine to sit: Min assist Sit to supine: Min guard   General bed mobility comments: Min assist to help progres left leg over EOB  Transfers Overall transfer level: Needs assistance Equipment used: Rolling walker (2 wheeled) Transfers: Sit to/from Stand Sit to Stand: Min guard         General transfer comment: verbal cues for safe hand placement, min guard assist for safety/balance from low bed  Ambulation/Gait Ambulation/Gait assistance: Supervision;Min guard Ambulation Distance (Feet): 180 Feet Assistive device: Rolling walker (2 wheeled) Gait Pattern/deviations: Step-through pattern;Antalgic Gait velocity: decreased Gait velocity interpretation: Below normal speed for age/gender General Gait Details: mildly antalgic gait pattern with verbal cues for good heel to toe pattern.             Balance Overall balance assessment: Needs assistance Sitting-balance support: Feet supported;No upper extremity supported Sitting balance-Leahy Scale: Good     Standing balance support: No upper extremity supported;Single extremity supported;Bilateral upper extremity supported Standing balance-Leahy Scale: Fair Standing balance comment: Reliant on RW for UE support during static standing tasks                    Cognition Arousal/Alertness: Awake/alert Behavior During Therapy: WFL for tasks assessed/performed Overall Cognitive Status: Within Functional Limits for tasks assessed                      Exercises Total Joint Exercises Ankle Circles/Pumps: AROM;Both;20 reps Quad Sets: AROM;Left;10 reps Towel Squeeze: AROM;Both;10 reps Short Arc Quad: AROM;Left;10 reps Heel Slides: AAROM;Left;10 reps Hip ABduction/ADduction: AROM;Left;10 reps Straight Leg Raises: AROM;AAROM;Left;10 reps Goniometric ROM: 15-45        Pertinent Vitals/Pain Pain Assessment: 0-10 Pain Score: 4  Pain Location: left thigh Pain Descriptors / Indicators: Aching;Burning Pain Intervention(s): Limited activity within patient's tolerance;Monitored during session;Repositioned    Home Living Family/patient expects to be discharged to:: Private residence Living Arrangements: Spouse/significant other Available Help at Discharge: Family;Available 24 hours/day;Neighbor (wife has health issues, other family and neighbors nearby) Type of Home: House Home Access: Stairs to enter Entrance Stairs-Rails: Right Home Layout: One level Home Equipment: Environmental consultant - 2 wheels;Shower seat;Grab bars - tub/shower;Bedside commode (BSC delivered to house already)      Prior Function Level of Independence: Independent      Comments: retired, has a part time Airline pilot route- [employer]  PT Goals (current goals can now be found in the care plan section) Acute Rehab PT Goals Patient Stated Goal: to  get back to golf and fishing Progress towards PT goals: Progressing toward goals    Frequency  7X/week    PT Plan Current plan remains appropriate       End of Session Equipment Utilized During Treatment: Gait belt Activity Tolerance: Patient limited by pain Patient left: in chair;with call bell/phone within reach     Time: 1126-1151 PT Time Calculation (min) (ACUTE ONLY): 25 min  Charges:  $Gait Training: 8-22 mins $Therapeutic Exercise: 8-22 mins                      Zniyah Midkiff B. Pamelyn Bancroft, PT, DPT [phone]   04/16/2015, 11:58 AM

## 2015-04-18 NOTE — Op Note (Signed)
NAMECONNIE, [name]:  [id number]  MEDICAL RECORD NO.:  [id number]  LOCATION:  [id number]                        FACILITY:  [facility]  PHYSICIAN:  Mila Homer. Sherlean Foot, M.D. DATE OF BIRTH:  [birth year]  DATE OF PROCEDURE:  04/15/2015 DATE OF DISCHARGE:  04/16/2015                              OPERATIVE REPORT   SURGEON:  Mila Homer. Sherlean Foot, M.D.  ASSISTANT:  Altamese Cabal, PA-C.  ANESTHESIA:  General.  PREOPERATIVE DIAGNOSIS:  Left severe osteoarthritis of the knee.  POSTOPERATIVE DIAGNOSIS:  Left severe osteoarthritis of the knee.  PROCEDURE:  Left total knee arthroplasty.  INDICATION FOR PROCEDURE:  The patient is a 75 year old white male, who has failed conservative measures.  Informed consent was obtained.  DESCRIPTION OF PROCEDURE:  The patient was laid in supine, administered general anesthesia.  The left knee was prepped and draped in the usual fashion.  The Esmarch was used to exsanguinate the extremity.  I then used a #10 blade to make a midline incision.  I used a new blade to make a median parapatellar arthrotomy.  I then performed a synovectomy.  I then elevated the deep MCL off the medial crest of the tibia.  I then used the extramedullary alignment guide on the tibia, make a perpendicular cut with 3 degrees posterior slope.  I used the patellar reaming system to ream and replaced an 8.5-mm prosthetic button.  I then used the intramedullary system and made a distal femoral cut in 6 degrees of valgus and used the four-in-one cutting block to make anterior, posterior and chamfer cuts parallel to the epicondylar axis. We then cleaned out the ACL, PCL, medial and lateral menisci, and posterior condylar osteophytes.  I then used 10-mm spacer block to ensure good flexion and extension gap balance.  I then finished the tibia with an E tibial tray, drill and keel.  I finished the femur with a size 7 cutting for the box, drilling for the lugs.  I then  trialed with a 7 femur, E tibia, 12 insert, 32 patella, negative flexion- extension gap balance and patellar tracking.  I then removed the trial components and copiously irrigated.  I then cemented the components with normal simplex cement, removed all excess cement and allowed the cement to harden and extension.  I then let the tourniquet down at 46 minutes. Obtained hemostasis.  I then closed the arthrotomy with figure-of-eight #1 Vicryl sutures, buried 0 Vicryl sutures, subcuticular 2-0 Vicryl sutures and skin staples.  Dressed with Xeroform, dressing sponges, sterile Webril, and Ace wrap.  COMPLICATIONS:  None.  DRAINS:  None.          ______________________________ Mila Homer. Sherlean Foot, M.D.     SDL/MEDQ  D:  04/18/2015  T:  04/18/2015  Job:  [id number]

## 2015-04-24 NOTE — Discharge Summary (Signed)
SPORTS MEDICINE & JOINT REPLACEMENT   Georgena Spurling, MD   Altamese Cabal, PA-C Laurier Nancy, PA-C [address] [place], [place], [place]  [address]                             [phone]  PATIENT ID: Alexander Ryan        MRN:  [id number]          DOB/AGE: 73-Aug-1942 / 75 y.o.    DISCHARGE SUMMARY  ADMISSION DATE:    04/15/2015 DISCHARGE DATE:   04/16/2015  ADMISSION DIAGNOSIS: primary osteoarthritis left knee    DISCHARGE DIAGNOSIS:  primary osteoarthritis left knee    ADDITIONAL DIAGNOSIS: Active Problems:   S/P total knee arthroplasty  Past Medical History  Diagnosis Date  . Hypertension   . Pneumonia     hx of  . Arthritis   . Bruises easily     PROCEDURE: Procedure(s): LEFT TOTAL KNEE ARTHROPLASTY on 04/15/2015  CONSULTS:     HISTORY:  See H&P in chart  HOSPITAL COURSE:  Cayle Thunder is a 75 y.o. admitted on 04/15/2015 and found to have a diagnosis of primary osteoarthritis left knee.  After appropriate laboratory studies were obtained  they were taken to the operating room on 04/15/2015 and underwent Procedure(s): LEFT TOTAL KNEE ARTHROPLASTY.   They were given perioperative antibiotics:  Anti-infectives    Start     Dose/Rate Route Frequency Ordered Stop   04/15/15 1445  ceFAZolin (ANCEF) IVPB 1 g/50 mL premix     1 g 100 mL/hr over 30 Minutes Intravenous Every 6 hours 04/15/15 1352 04/15/15 2138   04/15/15 0700  ceFAZolin (ANCEF) IVPB 2 g/50 mL premix     2 g 100 mL/hr over 30 Minutes Intravenous To ShortStay Surgical 04/12/15 1028 04/15/15 0745    .  Patient given tranexamic acid IV or topical and exparel intra-operatively.  Tolerated the procedure well.    POD# 1: Vital signs were stable.  Patient denied Chest pain, shortness of breath, or calf pain.  Patient was started on Lovenox 30 mg subcutaneously twice daily at 8am.  Consults to PT, OT, and care management were made.  The patient was weight bearing as tolerated.  CPM was placed on the operative  leg 0-90 degrees for 6-8 hours a day. When out of the CPM, patient was placed in the foam block to achieve full extension. Incentive spirometry was taught.  Dressing was changed.       POD #2, Continued  PT for ambulation and exercise program.  IV saline locked.  O2 discontinued.    The remainder of the hospital course was dedicated to ambulation and strengthening.   The patient was discharged on 1 day post op in  Good condition.  Blood products given:none  DIAGNOSTIC STUDIES: Recent vital signs: No data found.      Recent laboratory studies: No results for input(s): WBC, HGB, HCT, PLT in the last 168 hours. No results for input(s): NA, K, CL, CO2, BUN, CREATININE, GLUCOSE, CALCIUM in the last 168 hours. Lab Results  Component Value Date   INR 0.99 04/01/2015     Recent Radiographic Studies :  Dg Chest 2 View  04/01/2015  CLINICAL DATA:  Preop exam EXAM: CHEST - 2 VIEW COMPARISON:  CTA chest 10/02/2013. FINDINGS: The heart size and mediastinal contours are within normal limits. Both lungs are clear. The visualized skeletal structures are unremarkable. IMPRESSION: Negative two view chest x-ray  Electronically Signed   By: Marin Roberts M.D.   On: 04/01/2015 13:57    DISCHARGE INSTRUCTIONS:   DISCHARGE MEDICATIONS:     Medication List    TAKE these medications        amLODipine 10 MG tablet  Commonly known as:  NORVASC  Take 1 tablet by mouth daily.     celecoxib 200 MG capsule  Commonly known as:  CELEBREX  Take 1 capsule (200 mg total) by mouth every 12 (twelve) hours.     enoxaparin 40 MG/0.4ML injection  Commonly known as:  LOVENOX  Inject 0.4 mLs (40 mg total) into the skin daily.     losartan-hydrochlorothiazide 100-25 MG tablet  Commonly known as:  HYZAAR  Take 1 tablet by mouth daily.     methocarbamol 500 MG tablet  Commonly known as:  ROBAXIN  Take 1 tablet (500 mg total) by mouth every 6 (six) hours as needed for muscle spasms.     ondansetron 4  MG tablet  Commonly known as:  ZOFRAN  Take 1 tablet (4 mg total) by mouth every 6 (six) hours as needed for nausea.     oxyCODONE 5 MG immediate release tablet  Commonly known as:  Oxy IR/ROXICODONE  Take 1-2 tablets (5-10 mg total) by mouth every 3 (three) hours as needed for breakthrough pain.     oxyCODONE 10 mg 12 hr tablet  Commonly known as:  OXYCONTIN  Take 1 tablet (10 mg total) by mouth every 12 (twelve) hours.        FOLLOW UP VISIT:       Follow-up Information    Follow up with Raymon Mutton, MD. Call on 04/30/2015.   Specialty:  Orthopedic Surgery   Contact information:   [address] [place] [address] [phone]       Follow up with [facility].   Why:  They will contact you to schedule home therapy visits.   Contact information:   [address] [place] [address] [phone]       DISPOSITION: HOME  CONDITION:  Good   Roverto Bodmer 04/24/2015, 3:10 PM

## 2016-10-31 IMAGING — CR DG CHEST 2V
2 series · 2 of 2 positions shown · non-contrast
Comparison: CTA chest 10/02/2013.

CLINICAL DATA: Preop exam

EXAM:
CHEST - 2 VIEW

[w chest pa]
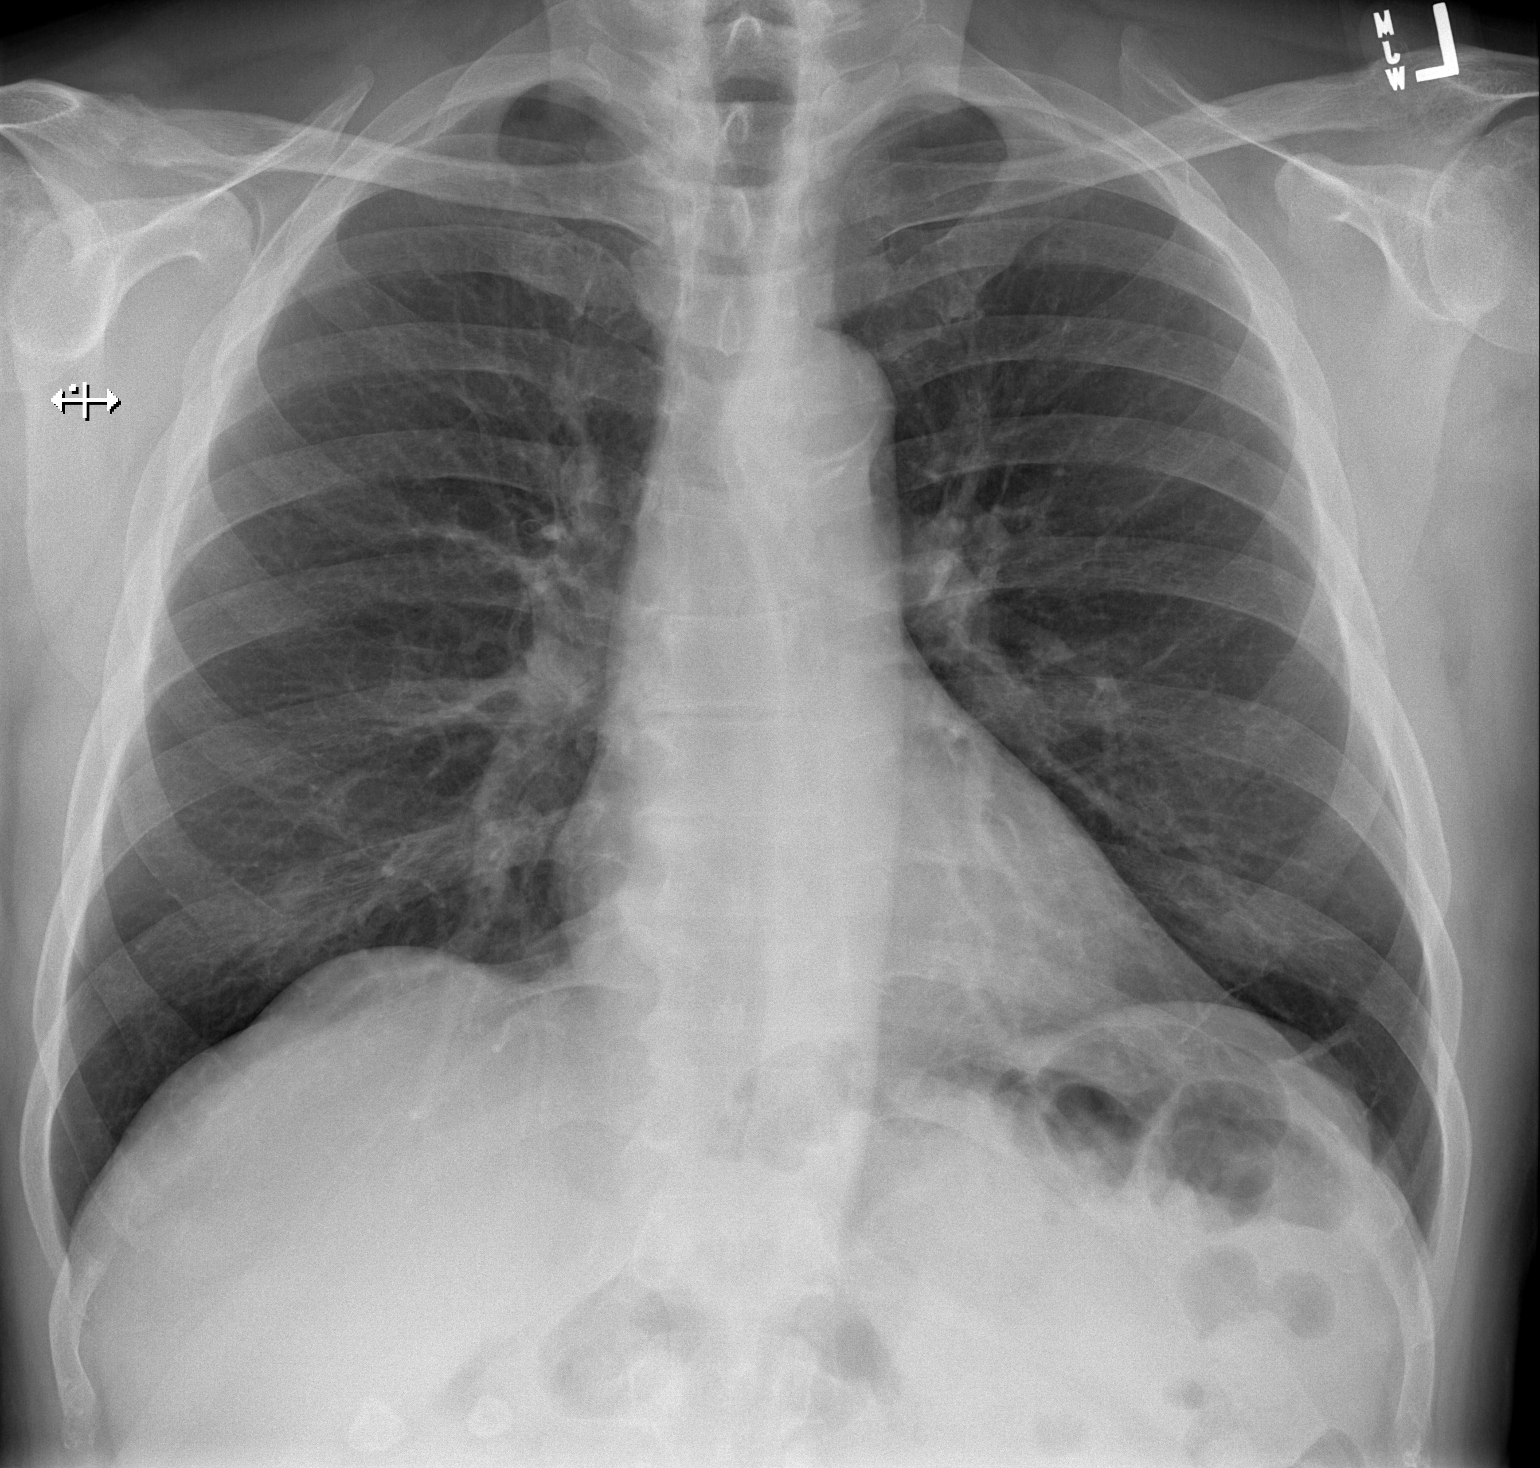

[w chest lat]
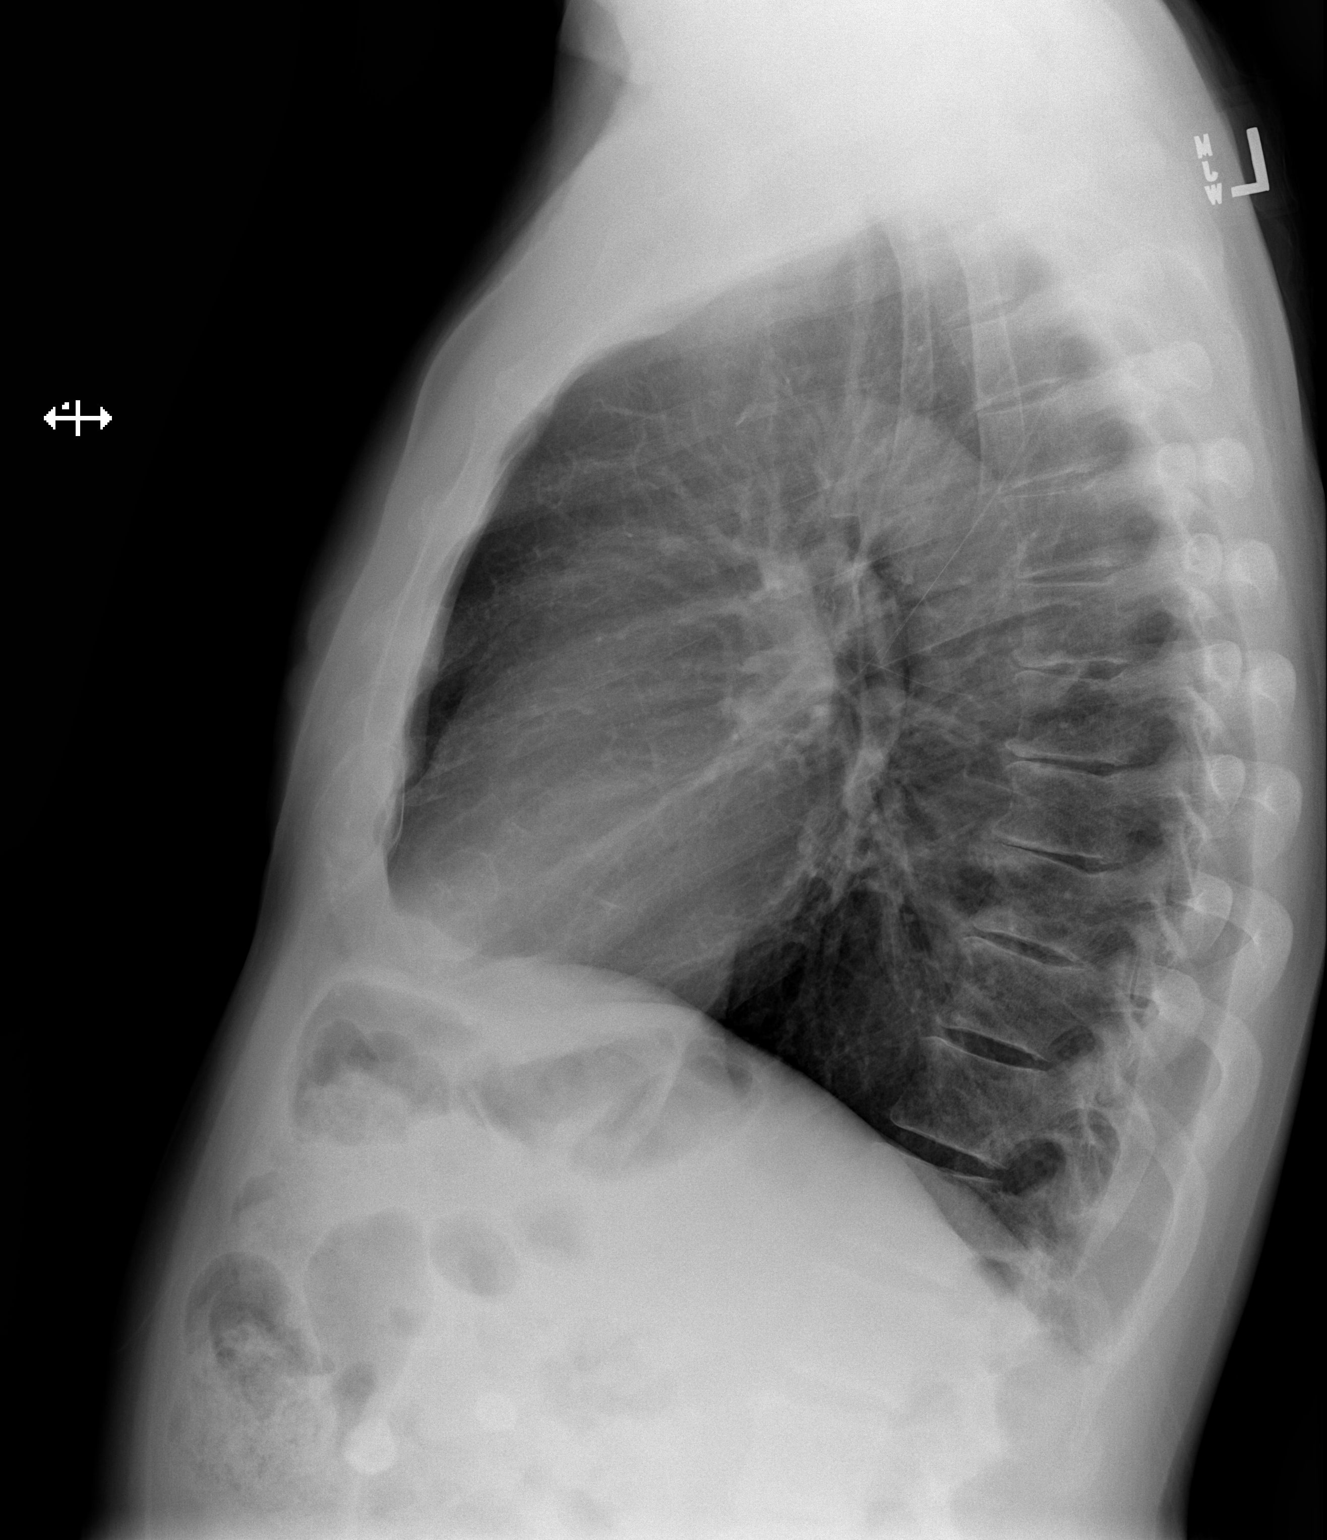

[2 of 2 positions shown; findings below may reference images not displayed]

FINDINGS: The heart size and mediastinal contours are within normal limits.
Both lungs are clear. The visualized skeletal structures are
unremarkable.
IMPRESSION: Negative two view chest x-ray

## 2020-06-13 DIAGNOSIS — R7303 Prediabetes: Secondary | ICD-10-CM | POA: Diagnosis not present

## 2020-06-13 DIAGNOSIS — E871 Hypo-osmolality and hyponatremia: Secondary | ICD-10-CM | POA: Diagnosis not present

## 2020-06-13 DIAGNOSIS — Z0000 Encounter for general adult medical examination without abnormal findings: Secondary | ICD-10-CM | POA: Diagnosis not present

## 2020-06-13 DIAGNOSIS — R7309 Other abnormal glucose: Secondary | ICD-10-CM | POA: Diagnosis not present

## 2020-06-13 DIAGNOSIS — J309 Allergic rhinitis, unspecified: Secondary | ICD-10-CM | POA: Diagnosis not present

## 2020-06-13 DIAGNOSIS — D692 Other nonthrombocytopenic purpura: Secondary | ICD-10-CM | POA: Diagnosis not present

## 2020-06-13 DIAGNOSIS — E782 Mixed hyperlipidemia: Secondary | ICD-10-CM | POA: Diagnosis not present

## 2020-06-13 DIAGNOSIS — D696 Thrombocytopenia, unspecified: Secondary | ICD-10-CM | POA: Diagnosis not present

## 2020-06-13 DIAGNOSIS — I10 Essential (primary) hypertension: Secondary | ICD-10-CM | POA: Diagnosis not present

## 2020-06-13 DIAGNOSIS — Z1389 Encounter for screening for other disorder: Secondary | ICD-10-CM | POA: Diagnosis not present

## 2020-06-14 DIAGNOSIS — E871 Hypo-osmolality and hyponatremia: Secondary | ICD-10-CM | POA: Diagnosis not present

## 2020-12-17 DIAGNOSIS — I10 Essential (primary) hypertension: Secondary | ICD-10-CM | POA: Diagnosis not present

## 2020-12-17 DIAGNOSIS — E782 Mixed hyperlipidemia: Secondary | ICD-10-CM | POA: Diagnosis not present

## 2020-12-17 DIAGNOSIS — R7303 Prediabetes: Secondary | ICD-10-CM | POA: Diagnosis not present

## 2020-12-17 DIAGNOSIS — J309 Allergic rhinitis, unspecified: Secondary | ICD-10-CM | POA: Diagnosis not present

## 2020-12-17 DIAGNOSIS — Z23 Encounter for immunization: Secondary | ICD-10-CM | POA: Diagnosis not present

## 2020-12-17 DIAGNOSIS — D696 Thrombocytopenia, unspecified: Secondary | ICD-10-CM | POA: Diagnosis not present

## 2020-12-17 DIAGNOSIS — M25561 Pain in right knee: Secondary | ICD-10-CM | POA: Diagnosis not present

## 2021-07-28 DIAGNOSIS — Z0000 Encounter for general adult medical examination without abnormal findings: Secondary | ICD-10-CM | POA: Diagnosis not present

## 2021-07-28 DIAGNOSIS — Z634 Disappearance and death of family member: Secondary | ICD-10-CM | POA: Diagnosis not present

## 2021-07-28 DIAGNOSIS — D692 Other nonthrombocytopenic purpura: Secondary | ICD-10-CM | POA: Diagnosis not present

## 2021-07-28 DIAGNOSIS — D696 Thrombocytopenia, unspecified: Secondary | ICD-10-CM | POA: Diagnosis not present

## 2021-07-28 DIAGNOSIS — E782 Mixed hyperlipidemia: Secondary | ICD-10-CM | POA: Diagnosis not present

## 2021-07-28 DIAGNOSIS — I10 Essential (primary) hypertension: Secondary | ICD-10-CM | POA: Diagnosis not present

## 2021-07-28 DIAGNOSIS — R7303 Prediabetes: Secondary | ICD-10-CM | POA: Diagnosis not present

## 2021-07-28 DIAGNOSIS — J309 Allergic rhinitis, unspecified: Secondary | ICD-10-CM | POA: Diagnosis not present

## 2022-03-12 DIAGNOSIS — I10 Essential (primary) hypertension: Secondary | ICD-10-CM | POA: Diagnosis not present

## 2022-03-12 DIAGNOSIS — E782 Mixed hyperlipidemia: Secondary | ICD-10-CM | POA: Diagnosis not present

## 2022-03-12 DIAGNOSIS — R202 Paresthesia of skin: Secondary | ICD-10-CM | POA: Diagnosis not present

## 2022-03-12 DIAGNOSIS — J309 Allergic rhinitis, unspecified: Secondary | ICD-10-CM | POA: Diagnosis not present

## 2022-03-12 DIAGNOSIS — R7303 Prediabetes: Secondary | ICD-10-CM | POA: Diagnosis not present

## 2022-03-12 DIAGNOSIS — D696 Thrombocytopenia, unspecified: Secondary | ICD-10-CM | POA: Diagnosis not present

## 2022-03-30 DIAGNOSIS — G5603 Carpal tunnel syndrome, bilateral upper limbs: Secondary | ICD-10-CM | POA: Diagnosis not present

## 2022-03-30 DIAGNOSIS — M180 Bilateral primary osteoarthritis of first carpometacarpal joints: Secondary | ICD-10-CM | POA: Diagnosis not present

## 2022-03-30 DIAGNOSIS — R200 Anesthesia of skin: Secondary | ICD-10-CM | POA: Diagnosis not present

## 2022-03-30 DIAGNOSIS — M19042 Primary osteoarthritis, left hand: Secondary | ICD-10-CM | POA: Diagnosis not present

## 2022-04-30 DIAGNOSIS — H524 Presbyopia: Secondary | ICD-10-CM | POA: Diagnosis not present

## 2022-04-30 DIAGNOSIS — H2513 Age-related nuclear cataract, bilateral: Secondary | ICD-10-CM | POA: Diagnosis not present

## 2022-04-30 DIAGNOSIS — H52223 Regular astigmatism, bilateral: Secondary | ICD-10-CM | POA: Diagnosis not present

## 2022-04-30 DIAGNOSIS — H5203 Hypermetropia, bilateral: Secondary | ICD-10-CM | POA: Diagnosis not present

## 2022-09-21 DIAGNOSIS — J309 Allergic rhinitis, unspecified: Secondary | ICD-10-CM | POA: Diagnosis not present

## 2022-09-21 DIAGNOSIS — I10 Essential (primary) hypertension: Secondary | ICD-10-CM | POA: Diagnosis not present

## 2022-09-21 DIAGNOSIS — D692 Other nonthrombocytopenic purpura: Secondary | ICD-10-CM | POA: Diagnosis not present

## 2022-09-21 DIAGNOSIS — M1711 Unilateral primary osteoarthritis, right knee: Secondary | ICD-10-CM | POA: Diagnosis not present

## 2022-09-21 DIAGNOSIS — E782 Mixed hyperlipidemia: Secondary | ICD-10-CM | POA: Diagnosis not present

## 2022-09-21 DIAGNOSIS — R7303 Prediabetes: Secondary | ICD-10-CM | POA: Diagnosis not present

## 2022-09-21 DIAGNOSIS — Z23 Encounter for immunization: Secondary | ICD-10-CM | POA: Diagnosis not present

## 2022-09-21 DIAGNOSIS — Z0000 Encounter for general adult medical examination without abnormal findings: Secondary | ICD-10-CM | POA: Diagnosis not present

## 2023-03-18 DIAGNOSIS — Z634 Disappearance and death of family member: Secondary | ICD-10-CM | POA: Diagnosis not present

## 2023-03-18 DIAGNOSIS — D696 Thrombocytopenia, unspecified: Secondary | ICD-10-CM | POA: Diagnosis not present

## 2023-03-18 DIAGNOSIS — I10 Essential (primary) hypertension: Secondary | ICD-10-CM | POA: Diagnosis not present

## 2023-03-18 DIAGNOSIS — R7303 Prediabetes: Secondary | ICD-10-CM | POA: Diagnosis not present

## 2023-03-18 DIAGNOSIS — M1711 Unilateral primary osteoarthritis, right knee: Secondary | ICD-10-CM | POA: Diagnosis not present

## 2023-03-18 DIAGNOSIS — J309 Allergic rhinitis, unspecified: Secondary | ICD-10-CM | POA: Diagnosis not present

## 2023-03-18 DIAGNOSIS — E782 Mixed hyperlipidemia: Secondary | ICD-10-CM | POA: Diagnosis not present

## 2023-03-18 DIAGNOSIS — Z23 Encounter for immunization: Secondary | ICD-10-CM | POA: Diagnosis not present

## 2023-04-20 DIAGNOSIS — M25561 Pain in right knee: Secondary | ICD-10-CM | POA: Diagnosis not present

## 2023-04-20 DIAGNOSIS — G8929 Other chronic pain: Secondary | ICD-10-CM | POA: Diagnosis not present

## 2023-05-14 DIAGNOSIS — R112 Nausea with vomiting, unspecified: Secondary | ICD-10-CM | POA: Diagnosis not present

## 2023-05-14 DIAGNOSIS — R197 Diarrhea, unspecified: Secondary | ICD-10-CM | POA: Diagnosis not present

## 2023-05-14 DIAGNOSIS — K5289 Other specified noninfective gastroenteritis and colitis: Secondary | ICD-10-CM | POA: Diagnosis not present

## 2023-06-15 DIAGNOSIS — I10 Essential (primary) hypertension: Secondary | ICD-10-CM | POA: Diagnosis not present

## 2023-06-15 DIAGNOSIS — M1711 Unilateral primary osteoarthritis, right knee: Secondary | ICD-10-CM | POA: Diagnosis not present

## 2023-07-02 DIAGNOSIS — G8929 Other chronic pain: Secondary | ICD-10-CM | POA: Diagnosis not present

## 2023-07-02 DIAGNOSIS — M1711 Unilateral primary osteoarthritis, right knee: Secondary | ICD-10-CM | POA: Diagnosis not present

## 2023-07-02 DIAGNOSIS — M25561 Pain in right knee: Secondary | ICD-10-CM | POA: Diagnosis not present

## 2023-07-12 DIAGNOSIS — G8918 Other acute postprocedural pain: Secondary | ICD-10-CM | POA: Diagnosis not present

## 2023-07-12 DIAGNOSIS — M1711 Unilateral primary osteoarthritis, right knee: Secondary | ICD-10-CM | POA: Diagnosis not present

## 2023-07-22 DIAGNOSIS — Z96651 Presence of right artificial knee joint: Secondary | ICD-10-CM | POA: Diagnosis not present

## 2023-07-22 DIAGNOSIS — M25561 Pain in right knee: Secondary | ICD-10-CM | POA: Diagnosis not present

## 2023-07-22 DIAGNOSIS — R2689 Other abnormalities of gait and mobility: Secondary | ICD-10-CM | POA: Diagnosis not present

## 2023-07-22 DIAGNOSIS — M6281 Muscle weakness (generalized): Secondary | ICD-10-CM | POA: Diagnosis not present

## 2023-07-27 DIAGNOSIS — M6281 Muscle weakness (generalized): Secondary | ICD-10-CM | POA: Diagnosis not present

## 2023-07-27 DIAGNOSIS — M25561 Pain in right knee: Secondary | ICD-10-CM | POA: Diagnosis not present

## 2023-07-27 DIAGNOSIS — R2689 Other abnormalities of gait and mobility: Secondary | ICD-10-CM | POA: Diagnosis not present

## 2023-07-30 DIAGNOSIS — R2689 Other abnormalities of gait and mobility: Secondary | ICD-10-CM | POA: Diagnosis not present

## 2023-07-30 DIAGNOSIS — M25561 Pain in right knee: Secondary | ICD-10-CM | POA: Diagnosis not present

## 2023-07-30 DIAGNOSIS — M6281 Muscle weakness (generalized): Secondary | ICD-10-CM | POA: Diagnosis not present

## 2023-08-03 DIAGNOSIS — M6281 Muscle weakness (generalized): Secondary | ICD-10-CM | POA: Diagnosis not present

## 2023-08-03 DIAGNOSIS — M25561 Pain in right knee: Secondary | ICD-10-CM | POA: Diagnosis not present

## 2023-08-03 DIAGNOSIS — R2689 Other abnormalities of gait and mobility: Secondary | ICD-10-CM | POA: Diagnosis not present

## 2023-08-06 DIAGNOSIS — M6281 Muscle weakness (generalized): Secondary | ICD-10-CM | POA: Diagnosis not present

## 2023-08-06 DIAGNOSIS — M25561 Pain in right knee: Secondary | ICD-10-CM | POA: Diagnosis not present

## 2023-08-06 DIAGNOSIS — R2689 Other abnormalities of gait and mobility: Secondary | ICD-10-CM | POA: Diagnosis not present

## 2023-08-09 DIAGNOSIS — M25561 Pain in right knee: Secondary | ICD-10-CM | POA: Diagnosis not present

## 2023-08-09 DIAGNOSIS — M6281 Muscle weakness (generalized): Secondary | ICD-10-CM | POA: Diagnosis not present

## 2023-08-09 DIAGNOSIS — R2689 Other abnormalities of gait and mobility: Secondary | ICD-10-CM | POA: Diagnosis not present

## 2023-08-13 DIAGNOSIS — M6281 Muscle weakness (generalized): Secondary | ICD-10-CM | POA: Diagnosis not present

## 2023-08-13 DIAGNOSIS — M25561 Pain in right knee: Secondary | ICD-10-CM | POA: Diagnosis not present

## 2023-08-13 DIAGNOSIS — R2689 Other abnormalities of gait and mobility: Secondary | ICD-10-CM | POA: Diagnosis not present

## 2023-08-17 DIAGNOSIS — M6281 Muscle weakness (generalized): Secondary | ICD-10-CM | POA: Diagnosis not present

## 2023-08-17 DIAGNOSIS — M25561 Pain in right knee: Secondary | ICD-10-CM | POA: Diagnosis not present

## 2023-08-17 DIAGNOSIS — R2689 Other abnormalities of gait and mobility: Secondary | ICD-10-CM | POA: Diagnosis not present

## 2023-08-20 DIAGNOSIS — M6281 Muscle weakness (generalized): Secondary | ICD-10-CM | POA: Diagnosis not present

## 2023-08-20 DIAGNOSIS — R2689 Other abnormalities of gait and mobility: Secondary | ICD-10-CM | POA: Diagnosis not present

## 2023-08-20 DIAGNOSIS — M25561 Pain in right knee: Secondary | ICD-10-CM | POA: Diagnosis not present

## 2023-08-23 DIAGNOSIS — M6281 Muscle weakness (generalized): Secondary | ICD-10-CM | POA: Diagnosis not present

## 2023-08-23 DIAGNOSIS — M25561 Pain in right knee: Secondary | ICD-10-CM | POA: Diagnosis not present

## 2023-08-23 DIAGNOSIS — M1711 Unilateral primary osteoarthritis, right knee: Secondary | ICD-10-CM | POA: Diagnosis not present

## 2023-08-23 DIAGNOSIS — E782 Mixed hyperlipidemia: Secondary | ICD-10-CM | POA: Diagnosis not present

## 2023-08-23 DIAGNOSIS — I10 Essential (primary) hypertension: Secondary | ICD-10-CM | POA: Diagnosis not present

## 2023-08-23 DIAGNOSIS — R2689 Other abnormalities of gait and mobility: Secondary | ICD-10-CM | POA: Diagnosis not present

## 2023-08-26 DIAGNOSIS — M6281 Muscle weakness (generalized): Secondary | ICD-10-CM | POA: Diagnosis not present

## 2023-08-26 DIAGNOSIS — M25561 Pain in right knee: Secondary | ICD-10-CM | POA: Diagnosis not present

## 2023-08-26 DIAGNOSIS — R2689 Other abnormalities of gait and mobility: Secondary | ICD-10-CM | POA: Diagnosis not present

## 2023-09-23 DIAGNOSIS — E782 Mixed hyperlipidemia: Secondary | ICD-10-CM | POA: Diagnosis not present

## 2023-09-23 DIAGNOSIS — M1711 Unilateral primary osteoarthritis, right knee: Secondary | ICD-10-CM | POA: Diagnosis not present

## 2023-09-23 DIAGNOSIS — I10 Essential (primary) hypertension: Secondary | ICD-10-CM | POA: Diagnosis not present

## 2023-10-24 DIAGNOSIS — M1711 Unilateral primary osteoarthritis, right knee: Secondary | ICD-10-CM | POA: Diagnosis not present

## 2023-10-24 DIAGNOSIS — I10 Essential (primary) hypertension: Secondary | ICD-10-CM | POA: Diagnosis not present

## 2023-10-24 DIAGNOSIS — E782 Mixed hyperlipidemia: Secondary | ICD-10-CM | POA: Diagnosis not present

## 2023-11-02 DIAGNOSIS — R7303 Prediabetes: Secondary | ICD-10-CM | POA: Diagnosis not present

## 2023-11-02 DIAGNOSIS — Z23 Encounter for immunization: Secondary | ICD-10-CM | POA: Diagnosis not present

## 2023-11-02 DIAGNOSIS — Z0000 Encounter for general adult medical examination without abnormal findings: Secondary | ICD-10-CM | POA: Diagnosis not present

## 2023-11-02 DIAGNOSIS — E782 Mixed hyperlipidemia: Secondary | ICD-10-CM | POA: Diagnosis not present

## 2023-11-02 DIAGNOSIS — J309 Allergic rhinitis, unspecified: Secondary | ICD-10-CM | POA: Diagnosis not present

## 2023-11-02 DIAGNOSIS — I10 Essential (primary) hypertension: Secondary | ICD-10-CM | POA: Diagnosis not present

## 2023-11-10 DIAGNOSIS — H04123 Dry eye syndrome of bilateral lacrimal glands: Secondary | ICD-10-CM | POA: Diagnosis not present

## 2023-11-10 DIAGNOSIS — H40033 Anatomical narrow angle, bilateral: Secondary | ICD-10-CM | POA: Diagnosis not present

## 2023-11-23 DIAGNOSIS — I10 Essential (primary) hypertension: Secondary | ICD-10-CM | POA: Diagnosis not present

## 2023-11-23 DIAGNOSIS — M1711 Unilateral primary osteoarthritis, right knee: Secondary | ICD-10-CM | POA: Diagnosis not present

## 2023-11-23 DIAGNOSIS — E782 Mixed hyperlipidemia: Secondary | ICD-10-CM | POA: Diagnosis not present
